# Patient Record
Sex: Female | Born: 1985 | Race: White | Hispanic: No | Marital: Married | Smoking: Never smoker
Health system: Southern US, Community
[De-identification: ages and names within clinical notes are randomized; demographics above are authoritative.]

## PROBLEM LIST (undated history)

## (undated) ENCOUNTER — Inpatient Hospital Stay (HOSPITAL_COMMUNITY): Payer: Self-pay

## (undated) DIAGNOSIS — I341 Nonrheumatic mitral (valve) prolapse: Secondary | ICD-10-CM

## (undated) DIAGNOSIS — F32A Depression, unspecified: Secondary | ICD-10-CM

## (undated) DIAGNOSIS — F419 Anxiety disorder, unspecified: Secondary | ICD-10-CM

## (undated) DIAGNOSIS — M51369 Other intervertebral disc degeneration, lumbar region without mention of lumbar back pain or lower extremity pain: Secondary | ICD-10-CM

## (undated) DIAGNOSIS — M5136 Other intervertebral disc degeneration, lumbar region: Secondary | ICD-10-CM

## (undated) DIAGNOSIS — J45909 Unspecified asthma, uncomplicated: Secondary | ICD-10-CM

## (undated) DIAGNOSIS — O139 Gestational [pregnancy-induced] hypertension without significant proteinuria, unspecified trimester: Secondary | ICD-10-CM

## (undated) DIAGNOSIS — O24419 Gestational diabetes mellitus in pregnancy, unspecified control: Secondary | ICD-10-CM

## (undated) DIAGNOSIS — K760 Fatty (change of) liver, not elsewhere classified: Secondary | ICD-10-CM

## (undated) HISTORY — PX: WISDOM TOOTH EXTRACTION: SHX21

---

## 2020-06-03 ENCOUNTER — Inpatient Hospital Stay (HOSPITAL_COMMUNITY)
Admission: AD | Admit: 2020-06-03 | Discharge: 2020-06-03 | Disposition: A | Discharge status: Home / Self Care | Payer: Managed Care, Other (non HMO) | Attending: Obstetrics and Gynecology | Admitting: Obstetrics and Gynecology

## 2020-06-03 ENCOUNTER — Other Ambulatory Visit: Payer: Self-pay

## 2020-06-03 ENCOUNTER — Encounter (HOSPITAL_COMMUNITY): Payer: Self-pay | Admitting: Obstetrics and Gynecology

## 2020-06-03 DIAGNOSIS — Z3A09 9 weeks gestation of pregnancy: Secondary | ICD-10-CM | POA: Insufficient documentation

## 2020-06-03 DIAGNOSIS — U071 COVID-19: Secondary | ICD-10-CM | POA: Diagnosis not present

## 2020-06-03 DIAGNOSIS — Z3A08 8 weeks gestation of pregnancy: Secondary | ICD-10-CM

## 2020-06-03 DIAGNOSIS — O98511 Other viral diseases complicating pregnancy, first trimester: Secondary | ICD-10-CM | POA: Diagnosis present

## 2020-06-03 MED ORDER — FLUTICASONE PROPIONATE 50 MCG/ACT NA SUSP: 1.0000 | Freq: Every day | NASAL | 2 refills | Status: DC

## 2020-06-03 NOTE — MAU Note (Addendum)
Drove home from work last night, works at [employer] well, had cold chills.  Woke up with fever.  Highest 102.5.  +home covid. test this morning. Grewal said to come in if fever got to 102.5.  Massive body aches, chills.headache.  Had 3 dizzy spells.  Confirmed IUP on [place] last Friday in office.  Was taking Tylenol q4hrs, last was 1000mg  at 1600.

## 2020-06-03 NOTE — Discharge Instructions (Signed)
Start flonase 1 spray each nare twice a day. Start Vitamin C Start Zinc. Return if increasing shortness of breath, vomiting. We will refer you to infusion center for monoclonal antibodies.

## 2020-06-03 NOTE — MAU Provider Note (Signed)
Event Date/Time   First Provider Initiated Contact with Patient 06/03/20 1719      S Ms. Danene Montijo is a 35 y.o. G1P0 patient who presents to MAU today with complaint of COVID 19 symptoms: fatigue, sore throat, fever. Symptoms started last night and had to call out of work. She took a test this morning, which was positive. Her OB doctor sent her here for evaluation. 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   O BP 126/79 (BP Location: Right Arm)   Pulse 96   Temp 99.2 F (37.3 C) (Oral)   Resp 20   SpO2 99%  Physical Exam Vitals and nursing note reviewed.  Cardiovascular:     Rate and Rhythm: Normal rate and regular rhythm.     Pulses: Normal pulses.     Heart sounds: Normal heart sounds.  Pulmonary:     Effort: Pulmonary effort is normal.     Breath sounds: Normal breath sounds.  Abdominal:     General: Abdomen is flat.     Palpations: Abdomen is soft.  Skin:    Capillary Refill: Capillary refill takes less than 2 seconds.  Neurological:     General: No focal deficit present.  Psychiatric:        Mood and Affect: Mood normal.        Behavior: Behavior normal.        Thought Content: Thought content normal.        Judgment: Judgment normal.     A Medical screening exam complete COVID   P Discharge from MAU in stable condition Start flonase to try prevent severe disease Start zinc/vitamin C.  Will get in to infusion center  Levie Heritage, DO 06/03/2020 5:29 PM

## 2020-06-04 ENCOUNTER — Telehealth: Payer: Self-pay | Admitting: Physician Assistant

## 2020-06-04 NOTE — Telephone Encounter (Signed)
Called to discuss with patient about COVID-19 symptoms and the use of one of the available treatments for those with mild to moderate Covid symptoms and at a high risk of hospitalization.  Pt appears to qualify for outpatient treatment due to co-morbid conditions and/or a member of an at-risk group in accordance with the FDA Emergency Use Authorization.    Symptom onset: 5/1 per MAU provider notes Vaccinated: no  Booster? no Immunocompromised? no Qualifiers: pregnant- 8 weeks NIH Criteria: 1  Unable to reach pt - left VM to call us back    [name]

## 2020-07-23 ENCOUNTER — Encounter: Payer: Self-pay | Admitting: Orthopaedic Surgery

## 2020-07-23 ENCOUNTER — Ambulatory Visit (INDEPENDENT_AMBULATORY_CARE_PROVIDER_SITE_OTHER): Payer: Managed Care, Other (non HMO) | Admitting: Orthopaedic Surgery

## 2020-07-23 DIAGNOSIS — M7062 Trochanteric bursitis, left hip: Secondary | ICD-10-CM | POA: Diagnosis not present

## 2020-07-23 DIAGNOSIS — M5432 Sciatica, left side: Secondary | ICD-10-CM | POA: Diagnosis not present
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
## 2020-07-24 MED ORDER — BUPIVACAINE HCL 0.25 % IJ SOLN
2.0000 mL | INTRAMUSCULAR | Status: AC | PRN
  Administered 2020-07-23: 2 mL via INTRA_ARTICULAR

## 2020-07-24 MED ORDER — LIDOCAINE HCL 1 % IJ SOLN
3.0000 mL | INTRAMUSCULAR | Status: AC | PRN
  Administered 2020-07-23: 3 mL

## 2020-07-24 MED ORDER — METHYLPREDNISOLONE ACETATE 40 MG/ML IJ SUSP
40.0000 mg | INTRAMUSCULAR | Status: AC | PRN
  Administered 2020-07-23: 40 mg via INTRA_ARTICULAR

## 2020-08-23 DIAGNOSIS — Z363 Encounter for antenatal screening for malformations: Secondary | ICD-10-CM | POA: Diagnosis not present

## 2020-08-23 DIAGNOSIS — Z3A20 20 weeks gestation of pregnancy: Secondary | ICD-10-CM | POA: Diagnosis not present

## 2020-08-23 DIAGNOSIS — Z3400 Encounter for supervision of normal first pregnancy, unspecified trimester: Secondary | ICD-10-CM | POA: Diagnosis not present

## 2020-09-17 DIAGNOSIS — Z3A23 23 weeks gestation of pregnancy: Secondary | ICD-10-CM | POA: Diagnosis not present

## 2020-09-17 DIAGNOSIS — Z362 Encounter for other antenatal screening follow-up: Secondary | ICD-10-CM | POA: Diagnosis not present

## 2020-09-17 DIAGNOSIS — Z3400 Encounter for supervision of normal first pregnancy, unspecified trimester: Secondary | ICD-10-CM | POA: Diagnosis not present

## 2020-10-09 DIAGNOSIS — Z3480 Encounter for supervision of other normal pregnancy, unspecified trimester: Secondary | ICD-10-CM | POA: Diagnosis not present

## 2020-10-09 DIAGNOSIS — Z3400 Encounter for supervision of normal first pregnancy, unspecified trimester: Secondary | ICD-10-CM | POA: Diagnosis not present

## 2020-10-09 DIAGNOSIS — Z23 Encounter for immunization: Secondary | ICD-10-CM | POA: Diagnosis not present

## 2020-10-09 DIAGNOSIS — Z3A26 26 weeks gestation of pregnancy: Secondary | ICD-10-CM | POA: Diagnosis not present

## 2020-10-21 DIAGNOSIS — O99810 Abnormal glucose complicating pregnancy: Secondary | ICD-10-CM | POA: Diagnosis not present

## 2020-11-06 ENCOUNTER — Encounter: Payer: 59 | Attending: Obstetrics and Gynecology | Admitting: Registered"

## 2020-11-06 ENCOUNTER — Other Ambulatory Visit: Payer: Self-pay

## 2020-11-06 ENCOUNTER — Encounter: Payer: Self-pay | Admitting: Registered"

## 2020-11-06 DIAGNOSIS — O24419 Gestational diabetes mellitus in pregnancy, unspecified control: Secondary | ICD-10-CM | POA: Insufficient documentation

## 2020-11-06 NOTE — Progress Notes (Signed)
Patient was seen on 11/06/20 for Gestational Diabetes self-management class at the [facility]. The following learning objectives were met by the patient during this course:  States the definition of Gestational Diabetes States why dietary management is important in controlling blood glucose Describes the effects each nutrient has on blood glucose levels Demonstrates ability to create a balanced meal plan Demonstrates carbohydrate counting  States when to check blood glucose levels Demonstrates proper blood glucose monitoring techniques States the effect of stress and exercise on blood glucose levels States the importance of limiting caffeine and abstaining from alcohol and smoking  Blood glucose monitor given: none - Patient has [employer] and needs [employer]. [facility] does not have samples of this brand of meter.  Patient instructed to monitor glucose levels: FBS: 60 - <95; 1 hour: <140; 2 hour: <120  Patient received handouts: Nutrition Diabetes and Pregnancy, including carb counting list  Patient will be seen for follow-up as needed.

## 2020-11-11 ENCOUNTER — Other Ambulatory Visit (HOSPITAL_COMMUNITY): Payer: Self-pay

## 2020-11-11 MED ORDER — FREESTYLE LANCETS MISC
0 refills | Status: DC
  Filled 2020-11-11: qty 100, 25d supply, fill #0

## 2020-11-11 MED ORDER — FREESTYLE LITE W/DEVICE KIT
PACK | Freq: Four times a day (QID) | 0 refills | Status: DC
  Filled 2020-11-11: qty 1, 30d supply, fill #0

## 2020-11-11 MED ORDER — FREESTYLE LITE TEST VI STRP
ORAL_STRIP | Freq: Four times a day (QID) | 0 refills | Status: DC
  Filled 2020-11-11: qty 100, 25d supply, fill #0

## 2020-11-12 ENCOUNTER — Other Ambulatory Visit (HOSPITAL_COMMUNITY): Payer: Self-pay

## 2020-11-15 ENCOUNTER — Other Ambulatory Visit (HOSPITAL_COMMUNITY): Payer: Self-pay

## 2020-11-20 DIAGNOSIS — Z3A32 32 weeks gestation of pregnancy: Secondary | ICD-10-CM | POA: Diagnosis not present

## 2020-11-20 DIAGNOSIS — Z3400 Encounter for supervision of normal first pregnancy, unspecified trimester: Secondary | ICD-10-CM | POA: Diagnosis not present

## 2020-11-20 DIAGNOSIS — O2693 Pregnancy related conditions, unspecified, third trimester: Secondary | ICD-10-CM | POA: Diagnosis not present

## 2020-11-28 ENCOUNTER — Inpatient Hospital Stay (HOSPITAL_BASED_OUTPATIENT_CLINIC_OR_DEPARTMENT_OTHER): Payer: 59

## 2020-11-28 ENCOUNTER — Other Ambulatory Visit: Payer: Self-pay

## 2020-11-28 ENCOUNTER — Inpatient Hospital Stay (HOSPITAL_COMMUNITY)
Admission: AD | Admit: 2020-11-28 | Discharge: 2020-12-06 | DRG: 786 | Disposition: A | Discharge status: Home / Self Care | Payer: 59 | Attending: Obstetrics and Gynecology | Admitting: Obstetrics and Gynecology

## 2020-11-28 ENCOUNTER — Encounter (HOSPITAL_COMMUNITY): Payer: Self-pay | Admitting: Obstetrics and Gynecology

## 2020-11-28 DIAGNOSIS — O1494 Unspecified pre-eclampsia, complicating childbirth: Secondary | ICD-10-CM | POA: Diagnosis not present

## 2020-11-28 DIAGNOSIS — Z20822 Contact with and (suspected) exposure to covid-19: Secondary | ICD-10-CM | POA: Diagnosis present

## 2020-11-28 DIAGNOSIS — O99213 Obesity complicating pregnancy, third trimester: Secondary | ICD-10-CM

## 2020-11-28 DIAGNOSIS — O99824 Streptococcus B carrier state complicating childbirth: Secondary | ICD-10-CM | POA: Diagnosis present

## 2020-11-28 DIAGNOSIS — O99892 Other specified diseases and conditions complicating childbirth: Secondary | ICD-10-CM | POA: Diagnosis not present

## 2020-11-28 DIAGNOSIS — O26893 Other specified pregnancy related conditions, third trimester: Secondary | ICD-10-CM | POA: Diagnosis present

## 2020-11-28 DIAGNOSIS — Z3A33 33 weeks gestation of pregnancy: Secondary | ICD-10-CM | POA: Diagnosis not present

## 2020-11-28 DIAGNOSIS — O24420 Gestational diabetes mellitus in childbirth, diet controlled: Secondary | ICD-10-CM | POA: Diagnosis present

## 2020-11-28 DIAGNOSIS — O09523 Supervision of elderly multigravida, third trimester: Secondary | ICD-10-CM | POA: Diagnosis not present

## 2020-11-28 DIAGNOSIS — O24419 Gestational diabetes mellitus in pregnancy, unspecified control: Secondary | ICD-10-CM | POA: Diagnosis not present

## 2020-11-28 DIAGNOSIS — O42113 Preterm premature rupture of membranes, onset of labor more than 24 hours following rupture, third trimester: Secondary | ICD-10-CM

## 2020-11-28 DIAGNOSIS — O24410 Gestational diabetes mellitus in pregnancy, diet controlled: Secondary | ICD-10-CM | POA: Diagnosis not present

## 2020-11-28 DIAGNOSIS — L299 Pruritus, unspecified: Secondary | ICD-10-CM | POA: Diagnosis present

## 2020-11-28 DIAGNOSIS — Z8759 Personal history of other complications of pregnancy, childbirth and the puerperium: Secondary | ICD-10-CM

## 2020-11-28 DIAGNOSIS — Z3A34 34 weeks gestation of pregnancy: Secondary | ICD-10-CM | POA: Diagnosis not present

## 2020-11-28 DIAGNOSIS — O43893 Other placental disorders, third trimester: Secondary | ICD-10-CM | POA: Diagnosis not present

## 2020-11-28 DIAGNOSIS — M545 Low back pain, unspecified: Secondary | ICD-10-CM | POA: Diagnosis present

## 2020-11-28 DIAGNOSIS — Z3689 Encounter for other specified antenatal screening: Secondary | ICD-10-CM

## 2020-11-28 DIAGNOSIS — O42913 Preterm premature rupture of membranes, unspecified as to length of time between rupture and onset of labor, third trimester: Principal | ICD-10-CM | POA: Diagnosis present

## 2020-11-28 DIAGNOSIS — E669 Obesity, unspecified: Secondary | ICD-10-CM | POA: Diagnosis not present

## 2020-11-28 DIAGNOSIS — G8929 Other chronic pain: Secondary | ICD-10-CM | POA: Diagnosis not present

## 2020-11-28 DIAGNOSIS — O99214 Obesity complicating childbirth: Secondary | ICD-10-CM | POA: Diagnosis not present

## 2020-11-28 DIAGNOSIS — O411230 Chorioamnionitis, third trimester, not applicable or unspecified: Secondary | ICD-10-CM | POA: Diagnosis present

## 2020-11-28 DIAGNOSIS — O24429 Gestational diabetes mellitus in childbirth, unspecified control: Secondary | ICD-10-CM | POA: Diagnosis not present

## 2020-11-28 DIAGNOSIS — O1404 Mild to moderate pre-eclampsia, complicating childbirth: Secondary | ICD-10-CM | POA: Diagnosis not present

## 2020-11-28 HISTORY — DX: Nonrheumatic mitral (valve) prolapse: I34.1

## 2020-11-28 LAB — URINALYSIS, ROUTINE W REFLEX MICROSCOPIC
Bilirubin Urine: NEGATIVE
Glucose, UA: NEGATIVE mg/dL
Hgb urine dipstick: NEGATIVE
Ketones, ur: NEGATIVE mg/dL
Nitrite: NEGATIVE
Protein, ur: NEGATIVE mg/dL
Specific Gravity, Urine: 1.013 (ref 1.005–1.030)
pH: 6 (ref 5.0–8.0)

## 2020-11-28 LAB — RPR: RPR Ser Ql: NONREACTIVE

## 2020-11-28 LAB — CBC
HCT: 32.9 % — ABNORMAL LOW (ref 36.0–46.0)
Hemoglobin: 10.7 g/dL — ABNORMAL LOW (ref 12.0–15.0)
MCH: 29.5 pg (ref 26.0–34.0)
MCHC: 32.5 g/dL (ref 30.0–36.0)
MCV: 90.6 fL (ref 80.0–100.0)
Platelets: 420 10*3/uL — ABNORMAL HIGH (ref 150–400)
RBC: 3.63 MIL/uL — ABNORMAL LOW (ref 3.87–5.11)
RDW: 13 % (ref 11.5–15.5)
WBC: 12.1 10*3/uL — ABNORMAL HIGH (ref 4.0–10.5)
nRBC: 0 % (ref 0.0–0.2)

## 2020-11-28 LAB — RESP PANEL BY RT-PCR (FLU A&B, COVID) ARPGX2
Influenza A by PCR: NEGATIVE
Influenza B by PCR: NEGATIVE
SARS Coronavirus 2 by RT PCR: NEGATIVE

## 2020-11-28 LAB — TYPE AND SCREEN
ABO/RH(D): A POS
Antibody Screen: NEGATIVE

## 2020-11-28 LAB — GLUCOSE, CAPILLARY
Glucose-Capillary: 157 mg/dL — ABNORMAL HIGH (ref 70–99)
Glucose-Capillary: 197 mg/dL — ABNORMAL HIGH (ref 70–99)

## 2020-11-28 LAB — GROUP B STREP BY PCR: Group B strep by PCR: POSITIVE — AB

## 2020-11-28 MED ORDER — LACTATED RINGERS IV SOLN: INTRAVENOUS | Status: DC

## 2020-11-28 MED ORDER — AZITHROMYCIN 250 MG PO TABS
1000.0000 mg | ORAL_TABLET | Freq: Once | ORAL | Status: AC
  Administered 2020-11-28: 1000 mg via ORAL
  Filled 2020-11-28: qty 4

## 2020-11-28 MED ORDER — SODIUM CHLORIDE 0.9 % IV SOLN
2.0000 g | Freq: Four times a day (QID) | INTRAVENOUS | Status: AC
  Administered 2020-11-28 – 2020-11-30 (×8): 2 g via INTRAVENOUS
  Filled 2020-11-28 (×8): qty 2000

## 2020-11-28 MED ORDER — LACTATED RINGERS IV BOLUS: 1000.0000 mL | Freq: Once | INTRAVENOUS | Status: DC

## 2020-11-28 MED ORDER — AMOXICILLIN 500 MG PO CAPS
500.0000 mg | ORAL_CAPSULE | Freq: Three times a day (TID) | ORAL | Status: DC
  Administered 2020-11-30 – 2020-12-02 (×8): 500 mg via ORAL
  Filled 2020-11-28 (×8): qty 1

## 2020-11-28 MED ORDER — SOD CITRATE-CITRIC ACID 500-334 MG/5ML PO SOLN
30.0000 mL | Freq: Once | ORAL | Status: AC
  Administered 2020-12-02: 30 mL via ORAL
  Filled 2020-11-28 (×2): qty 30

## 2020-11-28 MED ORDER — ZOLPIDEM TARTRATE 5 MG PO TABS: 5.0000 mg | ORAL_TABLET | Freq: Every evening | ORAL | Status: DC | PRN

## 2020-11-28 MED ORDER — BETAMETHASONE SOD PHOS & ACET 6 (3-3) MG/ML IJ SUSP
12.0000 mg | Freq: Once | INTRAMUSCULAR | Status: AC
  Administered 2020-11-28: 12 mg via INTRAMUSCULAR
  Filled 2020-11-28: qty 5

## 2020-11-28 MED ORDER — LORATADINE 10 MG PO TABS
10.0000 mg | ORAL_TABLET | Freq: Every day | ORAL | Status: DC
  Administered 2020-11-28 – 2020-12-01 (×4): 10 mg via ORAL
  Filled 2020-11-28 (×4): qty 1

## 2020-11-28 MED ORDER — FAMOTIDINE IN NACL 20-0.9 MG/50ML-% IV SOLN
20.0000 mg | Freq: Once | INTRAVENOUS | Status: AC
  Administered 2020-12-02: 20 mg via INTRAVENOUS
  Filled 2020-11-28: qty 50

## 2020-11-28 MED ORDER — ACETAMINOPHEN 500 MG PO TABS
1000.0000 mg | ORAL_TABLET | Freq: Four times a day (QID) | ORAL | Status: DC | PRN
  Administered 2020-11-28 – 2020-12-02 (×10): 1000 mg via ORAL
  Filled 2020-11-28 (×10): qty 2

## 2020-11-28 MED ORDER — CYCLOBENZAPRINE HCL 10 MG PO TABS
10.0000 mg | ORAL_TABLET | Freq: Every day | ORAL | Status: DC
  Administered 2020-11-28 – 2020-11-29 (×2): 10 mg via ORAL
  Filled 2020-11-28 (×2): qty 1

## 2020-11-28 MED ORDER — BETAMETHASONE SOD PHOS & ACET 6 (3-3) MG/ML IJ SUSP
12.0000 mg | Freq: Once | INTRAMUSCULAR | Status: AC
  Administered 2020-11-29: 12 mg via INTRAMUSCULAR

## 2020-11-28 MED ORDER — MAGNESIUM OXIDE -MG SUPPLEMENT 400 (240 MG) MG PO TABS
400.0000 mg | ORAL_TABLET | Freq: Every day | ORAL | Status: DC
  Administered 2020-11-28 – 2020-12-01 (×4): 400 mg via ORAL
  Filled 2020-11-28 (×4): qty 1

## 2020-11-28 NOTE — MAU Note (Signed)
NICU notified of pt's admission and status.  
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
## 2020-11-28 NOTE — MAU Note (Signed)
Vertex presentation verified via bedside ultrasound by Wynelle Bourgeois, CNM

## 2020-11-28 NOTE — H&P (Addendum)
[name] is a 35 y.o. female presenting for leaking of fluid at 1 am and again 2 am. No fever, no bleeding, no pain, few mild UCs here in MAU. Pregnancy complicated by AMA>normal Panorama, lumbar back pain and plans for a primary cesarean section, A1GDM with FBS <100. OB History     Gravida  1   Para      Term      Preterm      AB      Living         SAB      IAB      Ectopic      Multiple      Live Births             Past Medical History:  Diagnosis Date   Mitral valve prolapse    Past Surgical History:  Procedure Laterality Date   WISDOM TOOTH EXTRACTION     Family History: family history includes Cancer in her father; Hypertension in her mother. Social History:  reports that she has never smoked. She has never used smokeless tobacco. She reports that she does not drink alcohol and does not use drugs.     Maternal Diabetes: Yes:  Diabetes Type:  Diet controlled Genetic Screening: Normal Maternal Ultrasounds/Referrals: Normal Fetal Ultrasounds or other Referrals:  None Maternal Substance Abuse:  No Significant Maternal Medications:  None Significant Maternal Lab Results:  None Other Comments:  None  Review of Systems  Constitutional:  Negative for fever.  Eyes:  Negative for visual disturbance.  Gastrointestinal:  Negative for abdominal pain.  Neurological:  Negative for headaches.  Maternal Medical History:  Reason for admission: Rupture of membranes.   Fetal activity: Perceived fetal activity is normal.      Blood pressure 118/79, pulse (!) 103, temperature 98.2 F (36.8 C), temperature source Oral, resp. rate 18, height 5\' 3"  (1.6 m), weight 123.8 kg. Maternal Exam:  Abdomen: Fetal presentation: vertex   Fetal Exam Fetal State Assessment: Category I - tracings are normal.  Physical Exam Cardiovascular:     Rate and Rhythm: Normal rate.  Pulmonary:     Effort: Pulmonary effort is normal.    Uterus soft, NT Grossly ruptured,  fern positive per RN exam BSUS>vtx  Prenatal labs: ABO, Rh:   Antibody:   Rubella:   RPR:    HBsAg:    HIV:    GBS:     Assessment/Plan: 35 yo G1P0  PPROM at this time no evidence of infection, reassuring fetal status and not in labor  Latency ATB started  BMZ #1 done  IV fluids  U/S, MFM consult  D/W Neonatology>will consult D/W possible C/S for delivery because of back pain.  30 II 11/28/2020, 7:53 AM

## 2020-11-28 NOTE — Progress Notes (Signed)
S:Doing well since being admitted an hour or two ago.  Reports no contractions or bleeding.  +FM.   O:  Today's Vitals   11/28/20 0605 11/28/20 0606 11/28/20 0617 11/28/20 0828  BP: (!) 135/97  118/79 118/79  Pulse: (!) 113  (!) 103 (!) 103  Resp: 18   18  Temp: 98.2 F (36.8 C)     TempSrc: Oral     SpO2:    100%  Weight: 123.8 kg     Height: 5\' 3"  (1.6 m)     PainSc:  3      Body mass index is 48.36 kg/m.  NAD, A&O NWOB Abd soft, nondistended, gravid   A/P: 35yo G1P0 @ 33.6 wga with PPROM.   # PPROM - counseled regarding risks of expectant management including abruption, infection, and IUFD. Discussed indications for delivery including infection, labor, abruption or non-reassuring fetal status.  - Immediate delivery after BMZ vs expectant management was d/w patient in detail. I reviewed that historically, we delivered patients at 71 wga for PPROM. However, recent studies demonstrated that they are potential benefits to expectant management until 37.0 wga. One large study showed no significant difference in neonatal sepsis or morbidity but an increased risk of respiratory distress, NICU length of stay, and mechanical ventilation in the immediate delivery group. However, the maternal outcomes were 2 times higher for hemorrhage and infection in the expectant management to 37 wga group. Another study showed results and in addition showed higher risk of cesarean with immediate delivery.  We reviewed that latency antibiotics are not indicated if she desires expectant management. She is GBS positive and needs PCN in labor. Latency antibiotics will cover it for now.  I reiterated that expectant management would include VERY careful monitoring of s/s of chorio, maternal infection, and antepartum hemorrhage.  - We discussed tocolysis is not typically indicated as it can be associated with a higher risk of chorioamnionitis. In active labor, it is never indicated. It can be considered to  achieve BMZ administration at < 34.0 wga but used cautiously and never in the setting of infection or abruption.  - BMZ for FLM - Latency abx - No s/s of abruption, infection, or labor. Cervix closed visually on exam in MAU   # MWB - Liquid diet for now with IVF - A1GDM - great control. Continue FBS and 2 hr pp - Chronic back pain - Tylenol prn and Flexeril QHS  # FWB - CEFM - Growth 20 with MFM requested today - BMZ #1 10/27, #2 DUE 10/28  # ROD  - uncdecided. Previously had desired primary elective CS given hx chronic back pain. However, given PPROM and early gestation, she is considering vaginal delivery as long as IOL wouldn't be expected to be lengthy. Will continue to address. Risks of both reviewed.  - At this time, patient and husband will not likely delivery sooner than BMZ complete if possible. Will then readdress delivery plan of 37 wga or sooner.    # GBS: POSITIVE  # Dispo  11/28 MD

## 2020-11-28 NOTE — MAU Provider Note (Addendum)
Chief Complaint:  Rupture of Membranes   Event Date/Time   First Provider Initiated Contact with Patient 11/28/20 0612     HPI: [name] is a 35 y.o. G1P0 at 43w6dho presents to maternity admissions reporting leaking of fluid since 1am.  Has some pressure but no painful contractions. . She reports good fetal movement, denies vaginal bleeding, urinary symptoms, h/a, dizziness, n/v, diarrhea, constipation or fever/chills.  .  Vaginal Discharge The patient's primary symptoms include vaginal discharge. The patient's pertinent negatives include no genital itching, genital lesions, genital odor, pelvic pain or vaginal bleeding. This is a new problem. The current episode started today. The patient is experiencing no pain. She is pregnant. Pertinent negatives include no chills, dysuria, fever, nausea or vomiting. The vaginal discharge was clear and watery. There has been no bleeding. She has not been passing clots. She has not been passing tissue. Nothing aggravates the symptoms. She has tried nothing for the symptoms.     Past Medical History: History reviewed. No pertinent past medical history.  Past obstetric history: OB History  Gravida Para Term Preterm AB Living  1            SAB IAB Ectopic Multiple Live Births               # Outcome Date GA Lbr Len/2nd Weight Sex Delivery Anes PTL Lv  1 Current             Past Surgical History: No past surgical history on file.  Family History: No family history on file.  Social History:    Allergies:  Allergies  Allergen Reactions   Shellfish-Derived Products Anaphylaxis   Latex Rash    Meds:  Medications Prior to Admission  Medication Sig Dispense Refill Last Dose   b complex vitamins capsule Take 1 capsule by mouth daily.   11/27/2020   cyclobenzaprine (FLEXERIL) 10 MG tablet Take 10 mg by mouth 3 (three) times daily as needed for muscle spasms.   11/27/2020   levocetirizine (XYZAL) 5 MG tablet Take 5 mg by mouth every  evening.   11/27/2020   magnesium oxide (MAG-OX) 400 MG tablet Take 400 mg by mouth daily.   11/27/2020   Prenatal Vit-Fe Fumarate-FA (MULTIVITAMIN-PRENATAL) 27-0.8 MG TABS tablet Take 1 tablet by mouth daily at 12 noon.   11/27/2020   Blood Glucose Monitoring Suppl (FREESTYLE LITE) w/Device KIT Use as directed to test 4 times a day 1 kit 0    fluticasone (FLONASE) 50 MCG/ACT nasal spray Place 1 spray into both nostrils daily. 11.1 mL 2    glucose blood (FREESTYLE LITE) test strip Use as directed to TEST 4 TIMES DAILY 100 strip 0    Lancets (FREESTYLE) lancets Use as directed to test 4 times a day 100 each 0     I have reviewed patient's Past Medical Hx, Surgical Hx, Family Hx, Social Hx, medications and allergies.   ROS:  Review of Systems  Constitutional:  Negative for chills and fever.  Gastrointestinal:  Negative for nausea and vomiting.  Genitourinary:  Positive for vaginal discharge. Negative for dysuria and pelvic pain.  Other systems negative  Physical Exam   Vitals:   11/28/20 0605 11/28/20 0617  BP: (!) 135/97 118/79  Pulse: (!) 113 (!) 103  Resp: 18   Temp: 98.2 F (36.8 C)   TempSrc: Oral   Weight: 123.8 kg   Height: '5\' 3"'  (1.6 m)     Constitutional: Well-developed, well-nourished female in no acute distress.  Cardiovascular: normal rate and rhythm Respiratory: normal effort, clear to auscultation bilaterally GI: Abd soft, non-tender, gravid appropriate for gestational age.   No rebound or guarding. MS: Extremities nontender, no edema, normal ROM Neurologic: Alert and oriented x 4.  GU: Neg CVAT.  PELVIC EXAM:   Grossly ruptured per RN with + ferning  FHT:  Baseline 130 , moderate variability, accelerations present, no decelerations Contractions: Uterine irritability   Labs: Labs ordered    Imaging:  Pt informed that the ultrasound is considered a limited OB ultrasound and is not intended to be a complete ultrasound exam.  Patient also informed that the  ultrasound is not being completed with the intent of assessing for fetal or placental anomalies or any pelvic abnormalities.  Explained that the purpose of today's ultrasound is to assess for presentation.   Patient acknowledges the purpose of the exam and the limitations of the study.  Fetus is in the vertex presentation  MAU Course/MDM: I have ordered labs and IV, Betamethasone NST reviewed, reassuring Consult Dr Gaetano Net  with presentation, exam findings and test results.  Treatments in MAU included EFM.    Assessment: Single IUP at 20w6dPPROM Uterine irritability  Plan: Admit per Dr TGaetano NetPer records, planning elective Cesarean due to lumbar issues Betamethasone Latency Antibiotics MD to follow  MHansel FeinsteinCNM, MSN Certified Nurse-Midwife 11/28/2020 6:12 AM

## 2020-11-28 NOTE — Consult Note (Signed)
Asked by Dr.Tomblin to provide prenatal consultation for 35 y.o. G1 P0 who is now 33.[redacted] weeks EGA, with pregnancy complicated by PROM.  Presented this morning with leaking fluid, no signs of infection or preterm labor. She is being treated with betamethasone and antibiotics. Plan to defer delivery pending BMZ maturity unless there are signs of infection. Patient also has diet-controlled gestational DM.  Discussed with patient and FOB usual expectations for preterm infant at 4 - [redacted] weeks gestation, including possible needs for respiratory support, temp support, NG feedings, and IV access. Explained family-centered care approach including parent participation in rounds, decision-making, and also presented usual criteria for discharge. Projected possible length of stay in NICU until 37 - [redacted] wks EGA.  Discussed advantages of feeding with mother's milk and possible use of donor milk as "bridge" if needed until her supply is sufficient. Patient's mother is an LC with [employer].   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 They were attentive, had appropriate questions, and expressed appreciation for my input.  Thank you for consulting Neonatology.  Total time 25, face-to-face time 20 minutes  JWimmer, MD

## 2020-11-29 ENCOUNTER — Ambulatory Visit: Payer: Managed Care, Other (non HMO) | Admitting: Cardiology

## 2020-11-29 DIAGNOSIS — Z8759 Personal history of other complications of pregnancy, childbirth and the puerperium: Secondary | ICD-10-CM

## 2020-11-29 LAB — GLUCOSE, CAPILLARY
Glucose-Capillary: 115 mg/dL — ABNORMAL HIGH (ref 70–99)
Glucose-Capillary: 125 mg/dL — ABNORMAL HIGH (ref 70–99)
Glucose-Capillary: 129 mg/dL — ABNORMAL HIGH (ref 70–99)
Glucose-Capillary: 157 mg/dL — ABNORMAL HIGH (ref 70–99)

## 2020-11-29 MED ORDER — PRENATAL MULTIVITAMIN CH
1.0000 | ORAL_TABLET | Freq: Every day | ORAL | Status: DC
  Administered 2020-11-29 – 2020-12-02 (×4): 1 via ORAL
  Filled 2020-11-29 (×3): qty 1

## 2020-11-29 MED ORDER — B COMPLEX-C PO TABS
1.0000 | ORAL_TABLET | Freq: Every day | ORAL | Status: DC
  Administered 2020-11-30 – 2020-12-02 (×3): 1 via ORAL
  Filled 2020-11-29 (×4): qty 1

## 2020-11-29 NOTE — Progress Notes (Signed)
Patient ID: [name], female   DOB: 11-15-1985, 35 y.o.   MRN: [id number]   [name] is without complaints GFM  Occas leaking of clear fluid No ctxs or pain Was not on her best diet yesterday   VSSAF      98.7 F (37.1 C) 102 Abnormal  -- 19 133/74 Sitting 100 % Room Air -- -- --  FHR 140s with accels Ctxs none  Abd:  gravid nt Neg homans bilaterally  IUP at 34 0/[redacted] weeks EGA PPROM Now on latency abx S/P BMZ series GBS positive NICU consult completed A1DM previously in good control - re institute diabetic diet and some elevation expected from BMZ.  Continue to monitor Discussed timing of delivery.  Current no evidence of chorio and great fetal well being She desires primary c/s due to lower back problems.  Wants to consider 35 week range unless status changes

## 2020-11-30 LAB — GLUCOSE, CAPILLARY
Glucose-Capillary: 121 mg/dL — ABNORMAL HIGH (ref 70–99)
Glucose-Capillary: 100 mg/dL — ABNORMAL HIGH (ref 70–99)
Glucose-Capillary: 90 mg/dL (ref 70–99)
Glucose-Capillary: 94 mg/dL (ref 70–99)

## 2020-11-30 MED ORDER — CYCLOBENZAPRINE HCL 10 MG PO TABS
10.0000 mg | ORAL_TABLET | Freq: Three times a day (TID) | ORAL | Status: DC | PRN
  Administered 2020-11-30 – 2020-12-02 (×6): 10 mg via ORAL
  Filled 2020-11-30 (×6): qty 1

## 2020-11-30 NOTE — Progress Notes (Signed)
Patient ID: [name], female   DOB: November 17, 1985, 35 y.o.   MRN: [id number] No complaints today GFM  No ctxs Leaking clear fluid Compliant with diabetic diet  VSSAF  Glc levels 120-150s FHR 140s with accels No ctxs  Abd Gravid, nt Neg homan's bilaterally  FHR 140s with accels Ctxs none   Abd:  gravid nt Neg homans bilaterally   IUP at 34 1/[redacted] weeks EGA PPROM Now on latency abx S/P BMZ series GBS positive NICU consult completed A1DM previously in good control -diabetic diet and  with some elevation expected from BMZ. Will ask for diabetic coordinator to evaluate and office suggestions. Discussed timing of delivery.  Current no evidence of chorio and great fetal well being She desires primary c/s due to lower back problems.  Wants to consider 35 week range unless status changes

## 2020-11-30 NOTE — Progress Notes (Signed)
Received call back from Diabetes Coordinator.  Will look over chart and send recommendation.

## 2020-11-30 NOTE — Progress Notes (Signed)
Tray taken up

## 2020-11-30 NOTE — Progress Notes (Signed)
Inpatient Diabetes Program Recommendations  Diabetes Treatment Program Recommendations  ADA Standards of Care Diabetes in Pregnancy Target Glucose Ranges:  Fasting: 60 - 90 mg/dL Preprandial: 60 - 578 mg/dL 1 hr postprandial: Less than 140mg /dL (from first bite of meal) 2 hr postprandial: Less than 120 mg/dL (from first bite of meal)    Results for ALIE, MOUDY (MRN [name]) as of 11/30/2020 16:54  Ref. Range 11/30/2020 07:47 11/30/2020 12:24 11/30/2020 15:29  Glucose-Capillary Latest Ref Range: 70 - 99 mg/dL 12/02/2020 (H) 413 (H) 90  Results for PRABHNOOR, ELLENBERGER (MRN [name]) as of 11/30/2020 16:54  Ref. Range 11/29/2020 06:19 11/29/2020 10:07 11/29/2020 15:39 11/29/2020 21:05  Glucose-Capillary Latest Ref Range: 70 - 99 mg/dL 12/01/2020 (H)   Betamethasone 12 mg @6 :18 125 (H) 129 (H) 157 (H)  Results for JINNIFER, MONTEJANO (MRN ) as of 11/30/2020 16:54  Ref. Range 11/28/2020 7:14 11/28/2020 14:58 11/28/2020 20:22  Glucose-Capillary Latest Ref Range: 70 - 99 mg/dL     Betamethasone 12 mg 157 (H) 197 (H)    Review of Glycemic Control  Diabetes history: GDM; [redacted]W[redacted]D Outpatient Diabetes medications: None; diet controlled Current orders for Inpatient glycemic control: CBG monitoring  Inpatient Diabetes Program Recommendations:    Insulin: May want to consider ordering Novolog 0-14 units QID (fasting and 2 hour post prandial) for correction if needed.  NOTE: Received page from RN regarding consult for recommendations.  Diabetes Coordinator is not on campus over the weekend but available by pager from 8am to 5pm for questions or concerns. Per chart, patent has GDM which was diet controlled outpatient. Patient received Betamethasone 12 mg on 11/28/20 and second dose of Betamethasone 12 mg on 11/29/20. CBGs have ranged from 90-157 mg/dl over the past 24 hours. Would recommend ordering Novolog 0-14 units QID (fasting and 2 hour post prandial) for glucose correction if  needed. Will follow along while inpatient.  Thanks, 11/30/20, RN, MSN, CDE Diabetes Coordinator Inpatient Diabetes Program [phone] (Team Pager from 8am to 5pm)

## 2020-12-01 LAB — GLUCOSE, CAPILLARY
Glucose-Capillary: 89 mg/dL (ref 70–99)
Glucose-Capillary: 99 mg/dL (ref 70–99)
Glucose-Capillary: 95 mg/dL (ref 70–99)
Glucose-Capillary: 94 mg/dL (ref 70–99)

## 2020-12-01 LAB — CBC WITH DIFFERENTIAL/PLATELET
Abs Immature Granulocytes: 0.14 10*3/uL — ABNORMAL HIGH (ref 0.00–0.07)
Basophils Absolute: 0 10*3/uL (ref 0.0–0.1)
Basophils Relative: 0 %
Eosinophils Absolute: 0 10*3/uL (ref 0.0–0.5)
Eosinophils Relative: 0 %
HCT: 30.9 % — ABNORMAL LOW (ref 36.0–46.0)
Hemoglobin: 10.3 g/dL — ABNORMAL LOW (ref 12.0–15.0)
Immature Granulocytes: 1 %
Lymphocytes Relative: 15 %
Lymphs Abs: 1.8 10*3/uL (ref 0.7–4.0)
MCH: 30.6 pg (ref 26.0–34.0)
MCHC: 33.3 g/dL (ref 30.0–36.0)
MCV: 91.7 fL (ref 80.0–100.0)
Monocytes Absolute: 0.8 10*3/uL (ref 0.1–1.0)
Monocytes Relative: 7 %
Neutro Abs: 9.2 10*3/uL — ABNORMAL HIGH (ref 1.7–7.7)
Neutrophils Relative %: 77 %
Platelets: 382 10*3/uL (ref 150–400)
RBC: 3.37 MIL/uL — ABNORMAL LOW (ref 3.87–5.11)
RDW: 13.2 % (ref 11.5–15.5)
WBC: 11.9 10*3/uL — ABNORMAL HIGH (ref 4.0–10.5)
nRBC: 0 % (ref 0.0–0.2)

## 2020-12-01 NOTE — Progress Notes (Signed)
Patient ID: [name], female   DOB: [birth year], 35 y.o.   MRN: [id number] No complaints today GFM  No ctxs Leaking clear fluid Compliant with diabetic diet   VSSAF  Glc levels improved with 80-120s FHR 140s with accels No ctxs   Abd Gravid, nt Neg homan's bilaterally   Abd:  gravid nt Neg homans bilaterally   IUP at 34 2/[redacted] weeks EGA PPROM Now on latency abx S/P BMZ series GBS positive NICU consult completed A1DM improved yesterday with diabetic diet. Most likely some elevation from BMZ. Diabetic coordinator evaluated and recommend SSI prn, but glc levels excellent since.  Appreciate the consult. Discussed timing of delivery.  Current no evidence of chorio and great fetal well being.  She desires CBC with diff today She desires primary c/s due to lower back problems.  Wants to consider 35 week range unless status changes

## 2020-12-02 ENCOUNTER — Inpatient Hospital Stay (HOSPITAL_COMMUNITY): Payer: 59 | Admitting: Anesthesiology

## 2020-12-02 ENCOUNTER — Encounter (HOSPITAL_COMMUNITY): Payer: Self-pay | Admitting: Obstetrics and Gynecology

## 2020-12-02 ENCOUNTER — Encounter (HOSPITAL_COMMUNITY)
Admission: AD | Disposition: A | Discharge status: Home / Self Care | Payer: Self-pay | Source: Home / Self Care | Attending: Obstetrics and Gynecology

## 2020-12-02 LAB — COMPREHENSIVE METABOLIC PANEL
ALT: 92 U/L — ABNORMAL HIGH (ref 0–44)
AST: 40 U/L (ref 15–41)
Albumin: 2.6 g/dL — ABNORMAL LOW (ref 3.5–5.0)
Alkaline Phosphatase: 98 U/L (ref 38–126)
Anion gap: 10 (ref 5–15)
BUN: 13 mg/dL (ref 6–20)
CO2: 22 mmol/L (ref 22–32)
Calcium: 9.5 mg/dL (ref 8.9–10.3)
Chloride: 106 mmol/L (ref 98–111)
Creatinine, Ser: 0.68 mg/dL (ref 0.44–1.00)
GFR, Estimated: >60 mL/min (ref >60)
Glucose, Bld: 95 mg/dL (ref 70–99)
Potassium: 3.8 mmol/L (ref 3.5–5.1)
Sodium: 138 mmol/L (ref 135–145)
Total Bilirubin: 0.6 mg/dL (ref 0.3–1.2)
Total Protein: 5.9 g/dL — ABNORMAL LOW (ref 6.5–8.1)

## 2020-12-02 LAB — CBC
HCT: 31.6 % — ABNORMAL LOW (ref 36.0–46.0)
Hemoglobin: 10.5 g/dL — ABNORMAL LOW (ref 12.0–15.0)
MCH: 30.3 pg (ref 26.0–34.0)
MCHC: 33.2 g/dL (ref 30.0–36.0)
MCV: 91.1 fL (ref 80.0–100.0)
Platelets: 399 10*3/uL (ref 150–400)
RBC: 3.47 MIL/uL — ABNORMAL LOW (ref 3.87–5.11)
RDW: 13.2 % (ref 11.5–15.5)
WBC: 12.1 10*3/uL — ABNORMAL HIGH (ref 4.0–10.5)
nRBC: 0 % (ref 0.0–0.2)

## 2020-12-02 LAB — GLUCOSE, CAPILLARY
Glucose-Capillary: 89 mg/dL (ref 70–99)
Glucose-Capillary: 90 mg/dL (ref 70–99)
Glucose-Capillary: 88 mg/dL (ref 70–99)
Glucose-Capillary: 81 mg/dL (ref 70–99)
Glucose-Capillary: 95 mg/dL (ref 70–99)

## 2020-12-02 LAB — TYPE AND SCREEN
ABO/RH(D): A POS
Antibody Screen: NEGATIVE

## 2020-12-02 SURGERY — Surgical Case
Anesthesia: Spinal | Wound class: Clean Contaminated

## 2020-12-02 MED ORDER — PRENATAL MULTIVITAMIN CH
1.0000 | ORAL_TABLET | Freq: Every day | ORAL | Status: DC
  Administered 2020-12-04 – 2020-12-05 (×2): 1 via ORAL
  Filled 2020-12-02 (×2): qty 1

## 2020-12-02 MED ORDER — LACTATED RINGERS IV SOLN: INTRAVENOUS | Status: DC

## 2020-12-02 MED ORDER — DEXAMETHASONE SODIUM PHOSPHATE 4 MG/ML IJ SOLN
INTRAMUSCULAR | Status: DC | PRN
  Administered 2020-12-02: 4 mg via INTRAVENOUS

## 2020-12-02 MED ORDER — MAGNESIUM SULFATE 40 GM/1000ML IV SOLN
INTRAVENOUS | Status: AC
  Filled 2020-12-02: qty 1000

## 2020-12-02 MED ORDER — OXYTOCIN-SODIUM CHLORIDE 30-0.9 UT/500ML-% IV SOLN: 2.5000 [IU]/h | INTRAVENOUS | Status: AC

## 2020-12-02 MED ORDER — DIPHENHYDRAMINE HCL 25 MG PO CAPS: 25.0000 mg | ORAL_CAPSULE | ORAL | Status: DC | PRN

## 2020-12-02 MED ORDER — KETOROLAC TROMETHAMINE 30 MG/ML IJ SOLN
30.0000 mg | Freq: Four times a day (QID) | INTRAMUSCULAR | Status: DC | PRN
  Administered 2020-12-02: 30 mg via INTRAVENOUS

## 2020-12-02 MED ORDER — PHENYLEPHRINE HCL-NACL 20-0.9 MG/250ML-% IV SOLN
INTRAVENOUS | Status: AC
  Filled 2020-12-02: qty 250

## 2020-12-02 MED ORDER — PHENYLEPHRINE HCL-NACL 20-0.9 MG/250ML-% IV SOLN
INTRAVENOUS | Status: DC | PRN
  Administered 2020-12-02: 60 ug/min via INTRAVENOUS

## 2020-12-02 MED ORDER — NALBUPHINE HCL 10 MG/ML IJ SOLN: 5.0000 mg | Freq: Once | INTRAMUSCULAR | Status: DC | PRN

## 2020-12-02 MED ORDER — SIMETHICONE 80 MG PO CHEW: 80.0000 mg | CHEWABLE_TABLET | ORAL | Status: DC | PRN

## 2020-12-02 MED ORDER — OXYCODONE HCL 5 MG PO TABS: 5.0000 mg | ORAL_TABLET | ORAL | Status: DC | PRN

## 2020-12-02 MED ORDER — ONDANSETRON HCL 4 MG/2ML IJ SOLN
4.0000 mg | Freq: Three times a day (TID) | INTRAMUSCULAR | Status: DC | PRN
  Filled 2020-12-02: qty 2

## 2020-12-02 MED ORDER — NALOXONE HCL 0.4 MG/ML IJ SOLN: 0.4000 mg | INTRAMUSCULAR | Status: DC | PRN

## 2020-12-02 MED ORDER — MORPHINE SULFATE (PF) 0.5 MG/ML IJ SOLN
INTRAMUSCULAR | Status: DC | PRN
  Administered 2020-12-02: .15 mg via INTRATHECAL

## 2020-12-02 MED ORDER — TETANUS-DIPHTH-ACELL PERTUSSIS 5-2.5-18.5 LF-MCG/0.5 IM SUSY: 0.5000 mL | PREFILLED_SYRINGE | Freq: Once | INTRAMUSCULAR | Status: DC

## 2020-12-02 MED ORDER — MENTHOL 3 MG MT LOZG: 1.0000 | LOZENGE | OROMUCOSAL | Status: DC | PRN

## 2020-12-02 MED ORDER — SENNOSIDES-DOCUSATE SODIUM 8.6-50 MG PO TABS
2.0000 | ORAL_TABLET | Freq: Every day | ORAL | Status: DC
  Administered 2020-12-03 – 2020-12-05 (×3): 2 via ORAL
  Filled 2020-12-02 (×3): qty 2

## 2020-12-02 MED ORDER — MORPHINE SULFATE (PF) 0.5 MG/ML IJ SOLN
INTRAMUSCULAR | Status: AC
  Filled 2020-12-02: qty 10

## 2020-12-02 MED ORDER — NALBUPHINE HCL 10 MG/ML IJ SOLN: 5.0000 mg | INTRAMUSCULAR | Status: DC | PRN

## 2020-12-02 MED ORDER — DIPHENHYDRAMINE HCL 25 MG PO CAPS
25.0000 mg | ORAL_CAPSULE | Freq: Four times a day (QID) | ORAL | Status: DC | PRN
  Administered 2020-12-03: 25 mg via ORAL
  Filled 2020-12-02: qty 1

## 2020-12-02 MED ORDER — SODIUM CHLORIDE 0.9% FLUSH: 3.0000 mL | INTRAVENOUS | Status: DC | PRN

## 2020-12-02 MED ORDER — SODIUM CHLORIDE 0.9 % IV SOLN: 3.0000 g | Freq: Four times a day (QID) | INTRAVENOUS | Status: DC

## 2020-12-02 MED ORDER — FENTANYL CITRATE (PF) 100 MCG/2ML IJ SOLN: 25.0000 ug | INTRAMUSCULAR | Status: DC | PRN

## 2020-12-02 MED ORDER — NALOXONE HCL 4 MG/10ML IJ SOLN
1.0000 ug/kg/h | INTRAVENOUS | Status: DC | PRN
  Filled 2020-12-02: qty 5

## 2020-12-02 MED ORDER — KETOROLAC TROMETHAMINE 30 MG/ML IJ SOLN
INTRAMUSCULAR | Status: AC
  Filled 2020-12-02: qty 1

## 2020-12-02 MED ORDER — BUPIVACAINE IN DEXTROSE 0.75-8.25 % IT SOLN
INTRATHECAL | Status: DC | PRN
Start: 2020-12-02 — End: 2020-12-02
  Administered 2020-12-02: 1.6 mg via INTRATHECAL

## 2020-12-02 MED ORDER — WITCH HAZEL-GLYCERIN EX PADS: 1.0000 "application " | MEDICATED_PAD | CUTANEOUS | Status: DC | PRN

## 2020-12-02 MED ORDER — MAGNESIUM SULFATE 40 GM/1000ML IV SOLN
2.0000 g/h | INTRAVENOUS | Status: DC
  Administered 2020-12-03 – 2020-12-04 (×2): 2 g/h via INTRAVENOUS
  Filled 2020-12-02 (×2): qty 1000

## 2020-12-02 MED ORDER — SIMETHICONE 80 MG PO CHEW
80.0000 mg | CHEWABLE_TABLET | Freq: Three times a day (TID) | ORAL | Status: DC
  Administered 2020-12-03 – 2020-12-06 (×9): 80 mg via ORAL
  Filled 2020-12-02 (×9): qty 1

## 2020-12-02 MED ORDER — FENTANYL CITRATE (PF) 100 MCG/2ML IJ SOLN
INTRAMUSCULAR | Status: DC | PRN
  Administered 2020-12-02: 15 ug via INTRATHECAL

## 2020-12-02 MED ORDER — SODIUM CHLORIDE 0.9 % IV SOLN
INTRAVENOUS | Status: DC | PRN
  Administered 2020-12-02: 3 g via INTRAVENOUS

## 2020-12-02 MED ORDER — OXYTOCIN-SODIUM CHLORIDE 30-0.9 UT/500ML-% IV SOLN
INTRAVENOUS | Status: DC | PRN
  Administered 2020-12-02: 300 mL via INTRAVENOUS

## 2020-12-02 MED ORDER — KETOROLAC TROMETHAMINE 30 MG/ML IJ SOLN: 30.0000 mg | Freq: Four times a day (QID) | INTRAMUSCULAR | Status: DC | PRN

## 2020-12-02 MED ORDER — OXYTOCIN-SODIUM CHLORIDE 30-0.9 UT/500ML-% IV SOLN
INTRAVENOUS | Status: AC
  Filled 2020-12-02: qty 500

## 2020-12-02 MED ORDER — ONDANSETRON HCL 4 MG/2ML IJ SOLN
INTRAMUSCULAR | Status: AC
  Filled 2020-12-02: qty 2

## 2020-12-02 MED ORDER — MAGNESIUM SULFATE BOLUS VIA INFUSION
4.0000 g | Freq: Once | INTRAVENOUS | Status: AC
  Administered 2020-12-02: 4 g via INTRAVENOUS
  Filled 2020-12-02: qty 1000

## 2020-12-02 MED ORDER — KETOROLAC TROMETHAMINE 30 MG/ML IJ SOLN: 30.0000 mg | Freq: Once | INTRAMUSCULAR | Status: DC | PRN

## 2020-12-02 MED ORDER — SODIUM CHLORIDE 0.9 % IV SOLN
3.0000 g | Freq: Once | INTRAVENOUS | Status: DC
  Filled 2020-12-02: qty 8

## 2020-12-02 MED ORDER — DEXAMETHASONE SODIUM PHOSPHATE 4 MG/ML IJ SOLN
INTRAMUSCULAR | Status: AC
  Filled 2020-12-02: qty 1

## 2020-12-02 MED ORDER — SODIUM CHLORIDE 0.9 % IV SOLN
3.0000 g | Freq: Four times a day (QID) | INTRAVENOUS | Status: DC
  Administered 2020-12-02 – 2020-12-04 (×6): 3 g via INTRAVENOUS
  Filled 2020-12-02 (×6): qty 8

## 2020-12-02 MED ORDER — FENTANYL CITRATE (PF) 100 MCG/2ML IJ SOLN
INTRAMUSCULAR | Status: AC
  Filled 2020-12-02: qty 2

## 2020-12-02 MED ORDER — ONDANSETRON HCL 4 MG/2ML IJ SOLN
INTRAMUSCULAR | Status: DC | PRN
  Administered 2020-12-02: 4 mg via INTRAVENOUS

## 2020-12-02 MED ORDER — ACETAMINOPHEN 500 MG PO TABS: 1000.0000 mg | ORAL_TABLET | Freq: Four times a day (QID) | ORAL | Status: DC

## 2020-12-02 MED ORDER — COCONUT OIL OIL
1.0000 "application " | TOPICAL_OIL | Status: DC | PRN
  Administered 2020-12-05: 1 via TOPICAL

## 2020-12-02 MED ORDER — ZOLPIDEM TARTRATE 5 MG PO TABS: 5.0000 mg | ORAL_TABLET | Freq: Every evening | ORAL | Status: DC | PRN

## 2020-12-02 MED ORDER — DIBUCAINE (PERIANAL) 1 % EX OINT: 1.0000 "application " | TOPICAL_OINTMENT | CUTANEOUS | Status: DC | PRN

## 2020-12-02 MED ORDER — ACETAMINOPHEN 500 MG PO TABS
1000.0000 mg | ORAL_TABLET | Freq: Four times a day (QID) | ORAL | Status: DC
  Administered 2020-12-02 – 2020-12-06 (×12): 1000 mg via ORAL
  Filled 2020-12-02 (×14): qty 2

## 2020-12-02 MED ORDER — HYDROMORPHONE HCL 1 MG/ML IJ SOLN: 0.2000 mg | INTRAMUSCULAR | Status: DC | PRN

## 2020-12-02 MED ORDER — SCOPOLAMINE 1 MG/3DAYS TD PT72
1.0000 | MEDICATED_PATCH | Freq: Once | TRANSDERMAL | Status: AC
  Administered 2020-12-02: 1.5 mg via TRANSDERMAL
  Filled 2020-12-02: qty 1

## 2020-12-02 MED ORDER — DIPHENHYDRAMINE HCL 50 MG/ML IJ SOLN: 12.5000 mg | INTRAMUSCULAR | Status: DC | PRN

## 2020-12-02 SURGICAL SUPPLY — 33 items
BARRIER ADHS 3X4 INTERCEED (GAUZE/BANDAGES/DRESSINGS) IMPLANT
BENZOIN TINCTURE PRP APPL 2/3 (GAUZE/BANDAGES/DRESSINGS) ×2 IMPLANT
CHLORAPREP W/TINT 26ML (MISCELLANEOUS) ×2 IMPLANT
CLAMP CORD UMBIL (MISCELLANEOUS) IMPLANT
CLOTH BEACON ORANGE TIMEOUT ST (SAFETY) ×2 IMPLANT
DRSG OPSITE POSTOP 4X10 (GAUZE/BANDAGES/DRESSINGS) ×2 IMPLANT
ELECT REM PT RETURN 9FT ADLT (ELECTROSURGICAL) ×2
ELECTRODE REM PT RTRN 9FT ADLT (ELECTROSURGICAL) ×1 IMPLANT
EXTRACTOR VACUUM M CUP 4 TUBE (SUCTIONS) IMPLANT
GLOVE BIO SURGEON STRL SZ 6.5 (GLOVE) ×2 IMPLANT
GLOVE BIOGEL PI IND STRL 7.0 (GLOVE) ×1 IMPLANT
GLOVE BIOGEL PI INDICATOR 7.0 (GLOVE) ×1
GOWN STRL REUS W/TWL LRG LVL3 (GOWN DISPOSABLE) ×4 IMPLANT
KIT ABG SYR 3ML LUER SLIP (SYRINGE) IMPLANT
NEEDLE HYPO 22GX1.5 SAFETY (NEEDLE) IMPLANT
NEEDLE HYPO 25X5/8 SAFETYGLIDE (NEEDLE) ×2 IMPLANT
NS IRRIG 1000ML POUR BTL (IV SOLUTION) ×2 IMPLANT
PACK C SECTION WH (CUSTOM PROCEDURE TRAY) ×2 IMPLANT
PAD ABD DERMACEA PRESS 5X9 (GAUZE/BANDAGES/DRESSINGS) ×2 IMPLANT
PAD OB MATERNITY 4.3X12.25 (PERSONAL CARE ITEMS) ×2 IMPLANT
PENCIL SMOKE EVAC W/HOLSTER (ELECTROSURGICAL) ×2 IMPLANT
STRIP SURGICAL 1/4 X 6 IN (GAUZE/BANDAGES/DRESSINGS) ×2 IMPLANT
SUT CHROMIC 0 CTX 36 (SUTURE) ×4 IMPLANT
SUT PLAIN 0 NONE (SUTURE) IMPLANT
SUT PLAIN 2 0 XLH (SUTURE) IMPLANT
SUT VIC AB 0 CT1 27 (SUTURE) ×3
SUT VIC AB 0 CT1 27XBRD ANBCTR (SUTURE) ×3 IMPLANT
SUT VIC AB 4-0 KS 27 (SUTURE) IMPLANT
SYR CONTROL 10ML LL (SYRINGE) IMPLANT
TOWEL OR 17X24 6PK STRL BLUE (TOWEL DISPOSABLE) ×2 IMPLANT
TRAY FOLEY W/BAG SLVR 14FR LF (SET/KITS/TRAYS/PACK) ×2 IMPLANT
VACUUM CUP M-STYLE MYSTIC II (SUCTIONS) IMPLANT
WATER STERILE IRR 1000ML POUR (IV SOLUTION) ×2 IMPLANT

## 2020-12-02 NOTE — Progress Notes (Signed)
Continues to feel tightness across abdomen and malodorous AF No bleeding, no HA, no vision change  Today's Vitals   12/02/20 1444 12/02/20 1525 12/02/20 1529 12/02/20 1600  BP:   138/82 (!) 150/84  Pulse:   80 92  Resp:      Temp:    98.9 F (37.2 C)  TempSrc:    Oral  SpO2:      Weight:      Height:      PainSc: 3  3      Body mass index is 48.36 kg/m.   Abdomen-uterus mild tenderness DTR 2+  Results for orders placed or performed during the hospital encounter of 11/28/20 (from the past 24 hour(s))  Glucose, capillary     Status: None   Collection Time: 12/01/20  8:01 PM  Result Value Ref Range   Glucose-Capillary 94 70 - 99 mg/dL  Glucose, capillary     Status: None   Collection Time: 12/02/20  7:21 AM  Result Value Ref Range   Glucose-Capillary 89 70 - 99 mg/dL  Glucose, capillary     Status: None   Collection Time: 12/02/20 10:39 AM  Result Value Ref Range   Glucose-Capillary 90 70 - 99 mg/dL  Type and screen [facility]     Status: None   Collection Time: 12/02/20 11:15 AM  Result Value Ref Range   ABO/RH(D) A POS    Antibody Screen NEG    Sample Expiration      12/05/2020,2359 Performed at [facility], [address], [place], [place] [address]   Glucose, capillary     Status: None   Collection Time: 12/02/20  1:56 PM  Result Value Ref Range   Glucose-Capillary 88 70 - 99 mg/dL   Comment 1 Notify RN    Comment 2 Document in Chart   CBC     Status: Abnormal   Collection Time: 12/02/20  2:28 PM  Result Value Ref Range   WBC 12.1 (H) 4.0 - 10.5 K/uL   RBC 3.47 (L) 3.87 - 5.11 MIL/uL   Hemoglobin 10.5 (L) 12.0 - 15.0 g/dL   HCT 81.2 (L) 75.1 - 70.0 %   MCV 91.1 80.0 - 100.0 fL   MCH 30.3 26.0 - 34.0 pg   MCHC 33.2 30.0 - 36.0 g/dL   RDW 17.4 94.4 - 96.7 %   Platelets 399 150 - 400 K/uL   nRBC 0.0 0.0 - 0.2 %  Comprehensive metabolic panel     Status: Abnormal   Collection Time: 12/02/20  2:28 PM  Result Value Ref Range    Sodium 138 135 - 145 mmol/L   Potassium 3.8 3.5 - 5.1 mmol/L   Chloride 106 98 - 111 mmol/L   CO2 22 22 - 32 mmol/L   Glucose, Bld 95 70 - 99 mg/dL   BUN 13 6 - 20 mg/dL   Creatinine, Ser 5.91 0.44 - 1.00 mg/dL   Calcium 9.5 8.9 - 63.8 mg/dL   Total Protein 5.9 (L) 6.5 - 8.1 g/dL   Albumin 2.6 (L) 3.5 - 5.0 g/dL   AST 40 15 - 41 U/L   ALT 92 (H) 0 - 44 U/L   Alkaline Phosphatase 98 38 - 126 U/L   Total Bilirubin 0.6 0.3 - 1.2 mg/dL   GFR, Estimated >46 >65 mL/min   Anion gap 10 5 - 15  Glucose, capillary     Status: None   Collection Time: 12/02/20  4:33 PM  Result Value Ref Range   Glucose-Capillary 81 70 - 99 mg/dL     A/P: Chorioamnionitis         Preeclampsia - BPs elevated and now elevated LFT          D/W patient and husband. I recommend delivery. D/W cesarean section and risks including infection, organ damage, bleeding/transfusion-HIV/Hep, DVT/PE, pneumonia, wound breakdown. Will begin Unasyn. She states she understands and agrees.

## 2020-12-02 NOTE — Anesthesia Procedure Notes (Signed)
Spinal  Patient location during procedure: OR Start time: 12/02/2020 5:10 PM End time: 12/02/2020 5:15 PM Reason for block: surgical anesthesia Staffing Performed: anesthesiologist  Anesthesiologist: Elmer Picker, MD Preanesthetic Checklist Completed: patient identified, IV checked, risks and benefits discussed, surgical consent, monitors and equipment checked, pre-op evaluation and timeout performed Spinal Block Patient position: sitting Prep: DuraPrep and site prepped and draped Patient monitoring: cardiac monitor, continuous pulse ox and blood pressure Approach: midline Location: L3-4 Injection technique: single-shot Needle Needle type: Pencan  Needle gauge: 24 G Needle length: 9 cm Assessment Sensory level: T6 Events: CSF return Additional Notes Functioning IV was confirmed and monitors were applied. Sterile prep and drape, including hand hygiene and sterile gloves were used. The patient was positioned and the spine was prepped. The skin was anesthetized with lidocaine.  Free flow of clear CSF was obtained prior to injecting local anesthetic into the CSF.  The spinal needle aspirated freely following injection.  The needle was carefully withdrawn.  The patient tolerated the procedure well.

## 2020-12-02 NOTE — Brief Op Note (Signed)
12/02/2020  6:29 PM  PATIENT:  [name]  35 y.o. female  PRE-OPERATIVE DIAGNOSIS:  Cesarean Section-Premature rupture of membranes-chorio and GHTN  POST-OPERATIVE DIAGNOSIS:  Cesarean Section-Premature rupture of membranes-chorio and GHTN  PROCEDURE:  Procedure(s): CESAREAN SECTION (N/A)  SURGEON:  Surgeon(s) and Role:    * Harold Hedge, MD - Primary  PHYSICIAN ASSISTANT:   ASSISTANTS: none   ANESTHESIA:   spinal  EBL:  505 ml   BLOOD ADMINISTERED:none  DRAINS: Urinary Catheter (Foley)   LOCAL MEDICATIONS USED:  NONE  SPECIMEN:  Source of Specimen:  placenta  DISPOSITION OF SPECIMEN:  PATHOLOGY  COUNTS:  YES  TOURNIQUET:  * No tourniquets in log *  DICTATION: [id number]  PLAN OF CARE: Admit to inpatient   PATIENT DISPOSITION:  PACU - hemodynamically stable.   Delay start of Pharmacological VTE agent (>24hrs) due to surgical blood loss or risk of bleeding: not applicable

## 2020-12-02 NOTE — Transfer of Care (Signed)
Immediate Anesthesia Transfer of Care Note  Patient: [name]  Procedure(s) Performed: CESAREAN SECTION  Patient Location: PACU  Anesthesia Type:Epidural  Level of Consciousness: awake  Airway & Oxygen Therapy: Patient Spontanous Breathing  Post-op Assessment: Report given to RN  Post vital signs: Reviewed and stable  Last Vitals:  Vitals Value Taken Time  BP    Temp    Pulse    Resp    SpO2      Last Pain:  Vitals:   12/02/20 1600  TempSrc: Oral  PainSc:       Patients Stated Pain Goal: 4 (12/02/20 0830)  Complications: No notable events documented.

## 2020-12-02 NOTE — Anesthesia Preprocedure Evaluation (Addendum)
Anesthesia Evaluation    Reviewed: Allergy & Precautions, Patient's Chart, lab work & pertinent test results  Airway Mallampati: III  TM Distance: >3 FB Neck ROM: Full    Dental no notable dental hx.    Pulmonary neg pulmonary ROS,    Pulmonary exam normal breath sounds clear to auscultation       Cardiovascular hypertension (preE), Normal cardiovascular exam Rhythm:Regular Rate:Normal     Neuro/Psych negative neurological ROS  negative psych ROS   GI/Hepatic negative GI ROS, Neg liver ROS,   Endo/Other  diabetes, GestationalMorbid obesity (BMI 48)  Renal/GU negative Renal ROS  negative genitourinary   Musculoskeletal negative musculoskeletal ROS (+)   Abdominal   Peds  Hematology  (+) Blood dyscrasia (Hgb 10.5), anemia ,   Anesthesia Other Findings Primary C/S for preE and chorio. Preg also c/b PPROM  Reproductive/Obstetrics                           Anesthesia Physical Anesthesia Plan  ASA: 3  Anesthesia Plan: Spinal   Post-op Pain Management:    Induction:   PONV Risk Score and Plan: Treatment may vary due to age or medical condition  Airway Management Planned: Natural Airway  Additional Equipment:   Intra-op Plan:   Post-operative Plan:   Informed Consent: I have reviewed the patients History and Physical, chart, labs and discussed the procedure including the risks, benefits and alternatives for the proposed anesthesia with the patient or authorized representative who has indicated his/her understanding and acceptance.     Dental advisory given  Plan Discussed with: CRNA  Anesthesia Plan Comments:         Anesthesia Quick Evaluation

## 2020-12-02 NOTE — Progress Notes (Signed)
34 3/7 wks  Some leaking through the day-clear to yellow No UCs   Today's Vitals   12/01/20 1910 12/01/20 2156 12/02/20 0723 12/02/20 0724  BP:    128/82  Pulse:    86  Resp:    18  Temp:    97.9 F (36.6 C)  TempSrc:      SpO2:    98%  Weight:      Height:      PainSc: 1  Asleep 5     Body mass index is 48.36 kg/m.   Uterus NT, soft  NST + accels          UCs none  Results for orders placed or performed during the hospital encounter of 11/28/20 (from the past 24 hour(s))  Glucose, capillary     Status: None   Collection Time: 12/01/20 10:00 AM  Result Value Ref Range   Glucose-Capillary 99 70 - 99 mg/dL  CBC with Differential/Platelet     Status: Abnormal   Collection Time: 12/01/20 10:36 AM  Result Value Ref Range   WBC 11.9 (H) 4.0 - 10.5 K/uL   RBC 3.37 (L) 3.87 - 5.11 MIL/uL   Hemoglobin 10.3 (L) 12.0 - 15.0 g/dL   HCT 36.1 (L) 44.3 - 15.4 %   MCV 91.7 80.0 - 100.0 fL   MCH 30.6 26.0 - 34.0 pg   MCHC 33.3 30.0 - 36.0 g/dL   RDW 00.8 67.6 - 19.5 %   Platelets 382 150 - 400 K/uL   nRBC 0.0 0.0 - 0.2 %   Neutrophils Relative % 77 %   Neutro Abs 9.2 (H) 1.7 - 7.7 K/uL   Lymphocytes Relative 15 %   Lymphs Abs 1.8 0.7 - 4.0 K/uL   Monocytes Relative 7 %   Monocytes Absolute 0.8 0.1 - 1.0 K/uL   Eosinophils Relative 0 %   Eosinophils Absolute 0.0 0.0 - 0.5 K/uL   Basophils Relative 0 %   Basophils Absolute 0.0 0.0 - 0.1 K/uL   Immature Granulocytes 1 %   Abs Immature Granulocytes 0.14 (H) 0.00 - 0.07 K/uL  Glucose, capillary     Status: None   Collection Time: 12/01/20  2:56 PM  Result Value Ref Range   Glucose-Capillary 95 70 - 99 mg/dL  Glucose, capillary     Status: None   Collection Time: 12/01/20  8:01 PM  Result Value Ref Range   Glucose-Capillary 94 70 - 99 mg/dL  Glucose, capillary     Status: None   Collection Time: 12/02/20  7:21 AM  Result Value Ref Range   Glucose-Capillary 89 70 - 99 mg/dL     A/P: PPROM         Latency ATB          BMZ done         GBBS positive         NICU consult done         A1GDM         Good control         C/S for delivery due to low back pain          Will schedule

## 2020-12-02 NOTE — Progress Notes (Signed)
This morning she noticed fluid had a change in odor. This persists and now feels more tightness across abdomen. Continues to leak, no blood. No HA or vision change.  Today's Vitals   12/02/20 0724 12/02/20 0810 12/02/20 0830 12/02/20 1149  BP: 128/82   (!) 148/94  Pulse: 86   92  Resp: 18   18  Temp: 97.9 F (36.6 C)   98.8 F (37.1 C)  TempSrc:    Oral  SpO2: 98%   95%  Weight:      Height:      PainSc:  2  2     Body mass index is 48.36 kg/m.   Abdomen - mild tenderness across fundus that is a change from this morning.  A/P: D/W patient recent elevated BP (this followed a trip to [place] when her back was giving her pain)         Also now has some uterine tenderness and malodorous AF.         CBC, CMET just drawn         NPO, IV fluids         D/W possible C/S today. Will monitor closely.

## 2020-12-03 DIAGNOSIS — Z20822 Contact with and (suspected) exposure to covid-19: Secondary | ICD-10-CM | POA: Diagnosis not present

## 2020-12-03 DIAGNOSIS — O99214 Obesity complicating childbirth: Secondary | ICD-10-CM | POA: Diagnosis not present

## 2020-12-03 DIAGNOSIS — G8929 Other chronic pain: Secondary | ICD-10-CM | POA: Diagnosis not present

## 2020-12-03 DIAGNOSIS — O411230 Chorioamnionitis, third trimester, not applicable or unspecified: Secondary | ICD-10-CM | POA: Diagnosis not present

## 2020-12-03 DIAGNOSIS — O24420 Gestational diabetes mellitus in childbirth, diet controlled: Secondary | ICD-10-CM | POA: Diagnosis not present

## 2020-12-03 DIAGNOSIS — O99892 Other specified diseases and conditions complicating childbirth: Secondary | ICD-10-CM | POA: Diagnosis not present

## 2020-12-03 DIAGNOSIS — O1494 Unspecified pre-eclampsia, complicating childbirth: Secondary | ICD-10-CM | POA: Diagnosis not present

## 2020-12-03 DIAGNOSIS — O42913 Preterm premature rupture of membranes, unspecified as to length of time between rupture and onset of labor, third trimester: Secondary | ICD-10-CM | POA: Diagnosis not present

## 2020-12-03 LAB — CBC
HCT: 31.1 % — ABNORMAL LOW (ref 36.0–46.0)
Hemoglobin: 10.4 g/dL — ABNORMAL LOW (ref 12.0–15.0)
MCH: 30.3 pg (ref 26.0–34.0)
MCHC: 33.4 g/dL (ref 30.0–36.0)
MCV: 90.7 fL (ref 80.0–100.0)
Platelets: 399 10*3/uL (ref 150–400)
RBC: 3.43 MIL/uL — ABNORMAL LOW (ref 3.87–5.11)
RDW: 13.2 % (ref 11.5–15.5)
WBC: 19.8 10*3/uL — ABNORMAL HIGH (ref 4.0–10.5)
nRBC: 0 % (ref 0.0–0.2)

## 2020-12-03 LAB — COMPREHENSIVE METABOLIC PANEL
ALT: 129 U/L — ABNORMAL HIGH (ref 0–44)
AST: 45 U/L — ABNORMAL HIGH (ref 15–41)
Albumin: 2.5 g/dL — ABNORMAL LOW (ref 3.5–5.0)
Alkaline Phosphatase: 101 U/L (ref 38–126)
Anion gap: 7 (ref 5–15)
BUN: 13 mg/dL (ref 6–20)
CO2: 21 mmol/L — ABNORMAL LOW (ref 22–32)
Calcium: 8.3 mg/dL — ABNORMAL LOW (ref 8.9–10.3)
Chloride: 103 mmol/L (ref 98–111)
Creatinine, Ser: 0.45 mg/dL (ref 0.44–1.00)
GFR, Estimated: >60 mL/min (ref >60)
Glucose, Bld: 131 mg/dL — ABNORMAL HIGH (ref 70–99)
Potassium: 4 mmol/L (ref 3.5–5.1)
Sodium: 131 mmol/L — ABNORMAL LOW (ref 135–145)
Total Bilirubin: 0.5 mg/dL (ref 0.3–1.2)
Total Protein: 5.8 g/dL — ABNORMAL LOW (ref 6.5–8.1)

## 2020-12-03 LAB — MAGNESIUM: Magnesium: 3.8 mg/dL — ABNORMAL HIGH (ref 1.7–2.4)

## 2020-12-03 MED ORDER — CYCLOBENZAPRINE HCL 10 MG PO TABS
10.0000 mg | ORAL_TABLET | Freq: Three times a day (TID) | ORAL | Status: DC | PRN
  Administered 2020-12-03 – 2020-12-05 (×3): 10 mg via ORAL
  Filled 2020-12-03 (×4): qty 1

## 2020-12-03 MED ORDER — IBUPROFEN 600 MG PO TABS
600.0000 mg | ORAL_TABLET | Freq: Four times a day (QID) | ORAL | Status: DC | PRN
  Administered 2020-12-03 – 2020-12-06 (×11): 600 mg via ORAL
  Filled 2020-12-03 (×12): qty 1

## 2020-12-03 NOTE — Lactation Note (Signed)
This note was copied from a baby's chart. Lactation Consultation Note  Patient Name: [name] [id number] Date: 12/03/2020   Age:37 hours  This LC issued a [employer] employee pump, GOB also requested extra # 24 flanges which were also provided. No further questions or concerns at this time, family to call NICU LC PRN.   [name] S [name] 12/03/2020, 6:56 PM

## 2020-12-03 NOTE — Progress Notes (Signed)
POD # 1  C Section  PPROM  - Chorioamnionitis Preeclampsia - Elevated LFTs  S:  Patient currently in NICU visiting with the baby   O:  BP 132/70   Pulse 91   Temp 97.8 F (36.6 C) (Oral)   Resp 18   Ht 5\' 3"  (1.6 m)   Wt 123.8 kg   SpO2 95%   Breastfeeding Unknown   BMI 48.36 kg/m  Results for orders placed or performed during the hospital encounter of 11/28/20 (from the past 24 hour(s))  Glucose, capillary     Status: None   Collection Time: 12/02/20 10:39 AM  Result Value Ref Range   Glucose-Capillary 90 70 - 99 mg/dL  Type and screen [facility]     Status: None   Collection Time: 12/02/20 11:15 AM  Result Value Ref Range   ABO/RH(D) A POS    Antibody Screen NEG    Sample Expiration      12/05/2020,2359 Performed at [facility], [address] [place], [place]   Glucose, capillary     Status: None   Collection Time: 12/02/20  1:56 PM  Result Value Ref Range   Glucose-Capillary 88 70 - 99 mg/dL   Comment 1 Notify RN    Comment 2 Document in Chart   CBC     Status: Abnormal   Collection Time: 12/02/20  2:28 PM  Result Value Ref Range   WBC 12.1 (H) 4.0 - 10.5 K/uL   RBC 3.47 (L) 3.87 - 5.11 MIL/uL   Hemoglobin 10.5 (L) 12.0 - 15.0 g/dL   HCT 12/04/20 (L) 92.4 - 26.8 %   MCV 91.1 80.0 - 100.0 fL   MCH 30.3 26.0 - 34.0 pg   MCHC 33.2 30.0 - 36.0 g/dL   RDW 34.1 96.2 - 22.9 %   Platelets 399 150 - 400 K/uL   nRBC 0.0 0.0 - 0.2 %  Comprehensive metabolic panel     Status: Abnormal   Collection Time: 12/02/20  2:28 PM  Result Value Ref Range   Sodium 138 135 - 145 mmol/L   Potassium 3.8 3.5 - 5.1 mmol/L   Chloride 106 98 - 111 mmol/L   CO2 22 22 - 32 mmol/L   Glucose, Bld 95 70 - 99 mg/dL   BUN 13 6 - 20 mg/dL   Creatinine, Ser 12/04/20 0.44 - 1.00 mg/dL   Calcium 9.5 8.9 - 9.21 mg/dL   Total Protein 5.9 (L) 6.5 - 8.1 g/dL   Albumin 2.6 (L) 3.5 - 5.0 g/dL   AST 40 15 - 41 U/L   ALT 92 (H) 0 - 44 U/L   Alkaline Phosphatase 98 38  - 126 U/L   Total Bilirubin 0.6 0.3 - 1.2 mg/dL   GFR, Estimated 19.4 >17 mL/min   Anion gap 10 5 - 15  Glucose, capillary     Status: None   Collection Time: 12/02/20  4:33 PM  Result Value Ref Range   Glucose-Capillary 81 70 - 99 mg/dL  Glucose, capillary     Status: None   Collection Time: 12/02/20  7:17 PM  Result Value Ref Range   Glucose-Capillary 95 70 - 99 mg/dL  CBC     Status: Abnormal   Collection Time: 12/03/20  5:07 AM  Result Value Ref Range   WBC 19.8 (H) 4.0 - 10.5 K/uL   RBC 3.43 (L) 3.87 - 5.11 MIL/uL   Hemoglobin 10.4 (L) 12.0 - 15.0 g/dL  HCT 31.1 (L) 36.0 - 46.0 %   MCV 90.7 80.0 - 100.0 fL   MCH 30.3 26.0 - 34.0 pg   MCHC 33.4 30.0 - 36.0 g/dL   RDW 21.3 08.6 - 57.8 %   Platelets 399 150 - 400 K/uL   nRBC 0.0 0.0 - 0.2 %  Comprehensive metabolic panel     Status: Abnormal   Collection Time: 12/03/20  5:07 AM  Result Value Ref Range   Sodium 131 (L) 135 - 145 mmol/L   Potassium 4.0 3.5 - 5.1 mmol/L   Chloride 103 98 - 111 mmol/L   CO2 21 (L) 22 - 32 mmol/L   Glucose, Bld 131 (H) 70 - 99 mg/dL   BUN 13 6 - 20 mg/dL   Creatinine, Ser 4.69 0.44 - 1.00 mg/dL   Calcium 8.3 (L) 8.9 - 10.3 mg/dL   Total Protein 5.8 (L) 6.5 - 8.1 g/dL   Albumin 2.5 (L) 3.5 - 5.0 g/dL   AST 45 (H) 15 - 41 U/L   ALT 129 (H) 0 - 44 U/L   Alkaline Phosphatase 101 38 - 126 U/L   Total Bilirubin 0.5 0.3 - 1.2 mg/dL   GFR, Estimated >62 >95 mL/min   Anion gap 7 5 - 15  Magnesium     Status: Abnormal   Collection Time: 12/03/20  5:07 AM  Result Value Ref Range   Magnesium 3.8 (H) 1.7 - 2.4 mg/dL   Scheduled Meds:  acetaminophen  1,000 mg Oral Q6H   prenatal multivitamin  1 tablet Oral Q1200   scopolamine  1 patch Transdermal Once   senna-docusate  2 tablet Oral Daily   simethicone  80 mg Oral TID PC   Tdap  0.5 mL Intramuscular Once   Continuous Infusions:  ampicillin-sulbactam (UNASYN) 3 g IVPB (Mini-Bag Plus) 3 g (12/03/20 0541)   lactated ringers 75 mL/hr at 12/02/20  2330   magnesium sulfate 2 g/hr (12/02/20 1928)   naLOXone (NARCAN) adult infusion for PRURITIS     oxytocin Stopped (12/02/20 2330)   PRN Meds:.coconut oil, witch hazel-glycerin **AND** dibucaine, diphenhydrAMINE **OR** diphenhydrAMINE, diphenhydrAMINE, HYDROmorphone (DILAUDID) injection, ketorolac **OR** ketorolac, menthol-cetylpyridinium, nalbuphine **OR** nalbuphine, nalbuphine **OR** nalbuphine, naloxone **AND** sodium chloride flush, naLOXone (NARCAN) adult infusion for PRURITIS, ondansetron (ZOFRAN) IV, oxyCODONE, simethicone, zolpidem  Abdomen is soft and non tender Bandage clean and dry   POD # 1  Above  Continue Magnesium until tomorrow am - repeat CBC and CMET and consider discharge of Magnesium then Unasyn - continue until tomorrow

## 2020-12-03 NOTE — Anesthesia Postprocedure Evaluation (Signed)
Anesthesia Post Note  Patient: [name]  Procedure(s) Performed: CESAREAN SECTION     Patient location during evaluation: PACU Anesthesia Type: Spinal Level of consciousness: oriented and awake and alert Pain management: pain level controlled Vital Signs Assessment: post-procedure vital signs reviewed and stable Respiratory status: spontaneous breathing, respiratory function stable and patient connected to nasal cannula oxygen Cardiovascular status: blood pressure returned to baseline and stable Postop Assessment: no headache, no backache and no apparent nausea or vomiting Anesthetic complications: no   No notable events documented.  Last Vitals:  Vitals:   12/03/20 0400 12/03/20 0543  BP:  123/78  Pulse:  78  Resp: 16 17  Temp:  36.6 C  SpO2:  95%    Last Pain:  Vitals:   12/03/20 0543  TempSrc: Oral  PainSc:                  [name]

## 2020-12-03 NOTE — Op Note (Signed)
[name], [name] MEDICAL RECORD NO: [id number] ACCOUNT NO: [id number] DATE OF BIRTH: 1985/06/08 FACILITY: [facility] LOCATION: [facility] PHYSICIAN: Guy Sandifer. Arleta Creek, MD  Operative Report   DATE OF PROCEDURE: 12/02/2020  PREOPERATIVE DIAGNOSES:   1.  Intrauterine pregnancy at 34-3/7th weeks. 2.  Preterm premature rupture of membranes. 3.  Chorioamnionitis. 4.  Preeclampsia.  POSTOPERATIVE DIAGNOSES:   1.  Intrauterine pregnancy at 34-3/7th weeks. 2.  Preterm premature rupture of membranes. 3.  Chorioamnionitis. 4.  Preeclampsia.  PROCEDURE:  Primary low transverse cesarean section.  ASSISTANT:  Guy Sandifer. Henderson Cloud II, MD  ANESTHESIA:  Spinal.  ESTIMATED BLOOD LOSS:  505 mL  SPECIMENS:  Placenta to pathology.  FINDINGS:  Viable female infant, Apgars, arterial cord pH, birth weight pending.  INDICATIONS AND CONSENT:  This patient is a 35 year old G1 P0 at 82 and 3/7th weeks.  She was admitted to the hospital on 11/28/2020 with ruptured membranes.  She continued to leak clear fluid.  She was placed on latency antibiotics and was without signs  of infection.  She was given betamethasone on 10/27 and 10/28.  Today, fluid remains clear, but has a distinct change in odor to the patient.  In addition, as the day wore on, she developed tightness and mild tenderness over the uterus across her  abdomen.  She remained afebrile and white count today is 12.1.  However, her blood pressures became more labile and her ALT was elevated at 92. Out of concern for evolving chorioamnionitis and now preeclampsia, delivery is recommended.  The patient has a  long history of low back pain and requested primary cesarean section.  The procedure and risks were discussed preoperatively including but not limited to infection, organ damage, bleeding requiring transfusion of blood products with HIV and hepatitis  acquisition, DVT, PE, pneumonia and wound breakdown.  DESCRIPTION OF PROCEDURE:  The patient was  taken to the operating room where she was identified.  Spinal anesthetic was placed and she was placed in the dorsal supine position with a 15-degree left lateral wedge.  She was prepped vaginally with  ChloraPrep.  Foley catheter was placed.  Abdomen is elevated with a Traxi and she was prepped abdominally with ChloraPrep.  Timeout was done and after a 3-minute drying time, she was draped in a sterile fashion.  After testing for adequate spinal  anesthetic, skin was entered through a Pfannenstiel incision and dissection was carried out in layers to the peritoneum, which was entered bluntly and extended superiorly and inferiorly.  The vesicouterine peritoneum was taken down cephalad laterally and  a low transverse incision is made.  The uterine cavity was entered bluntly with a hemostat and extended with the fingers.  Vertex was then delivered and the baby was delivered without difficulty.  A good cry and tone is noted.  After 1 minute, cord was  clamped and cut and the baby was handed to the waiting pediatrics team.  Placenta was manually delivered and sent to pathology.  Uterine cavity is clean.  Uterus was closed in 2 running locking imbricating layers of 0 Monocryl suture, which achieved good  hemostasis.  Lavage was carried out and all returned as clear.  Anterior peritoneum was closed in running fashion with 0 Monocryl suture, which was also used to reapproximate the pyramidalis muscle in the midline.  The anterior rectus fascia was closed  in a running fashion with a 0 looped PDS.  Subcutaneous layer was closed in interrupted fashion with 2-0 plain and the skin was closed  in a subcuticular fashion with a 4-0 Vicryl on a Keith needle.  Benzoin, Steri-Strips, honeycomb and pressure dressing  was applied.  All counts were correct, and the patient was taken to the recovery room in stable condition.     PAA D: 12/02/2020 6:36:45 pm T: 12/03/2020 1:15:00 am  JOB: [id number]/ [id number]

## 2020-12-03 NOTE — Lactation Note (Signed)
This note was copied from a baby's chart. Lactation Consultation Note  Patient Name: [name] Date: 12/03/2020 Reason for consult: Early term 37-38.6wks;Maternal endocrine disorder;Initial assessment;Primapara;1st time breastfeeding;Other (Comment);Late-preterm 34-36.6wks (AMA) Age:37 hours  Visited with mom of 20 hours old LPI NICU female, she's a P1 and reported (+) breast changes during the pregnancy. Mom has a DEBP set up in her room, but she hasn't used it yet, she's been on Mag and not feeling at her best.   However, she's been diligent about hand expression and already getting some drops, every 3 hours, praised her for her efforts. [name], an experienced LC that has been helping mom with her pumping and BF questions.   Mom requested a feeding assist in the NICU at 2:45 pm but baby wouldn't wake up. LC undressed baby and try to aroused him but he was still asleep, an attempt was documented.   Reviewed pumping schedule, expectations, feeding cues, lactogenesis II and benefits of premature milk for NICU baby.  Plan of care:  Encouraged mom to start pumping ASAP, at least 8 times/24 hours She'll continue hand expressing every 3 hours prior pumping LC will issue [employer] employee pump to mom, FOB is a [employer] employee  BF brochure, BF resources and NICU booklet were reviewed. GOB and FOB present and very supportive. All questions and concerns answered, family to call NICU LC PRN.  Maternal Data Has patient been taught Hand Expression?: Yes Does the patient have breastfeeding experience prior to this delivery?: No  Feeding Mother's Current Feeding Choice: Breast Milk and Donor Milk  Lactation Tools Discussed/Used Tools: Pump;Flanges Flange Size: 24;21 Breast pump type: Double-Electric Breast Pump Pump Education: Setup, frequency, and cleaning;Milk Storage Reason for Pumping: LPI in NICU Pumping frequency: q 3 hours (recommended) Pumped volume:   (drops)  Discharge Pump: DEBP;Employee Pump (FOB is a Producer, television/film/video, mom is under her insurance and wants to get the [employer] employee pump.)  Consult Status Consult Status: Follow-up Date: 12/03/20 Follow-up type: In-patient   [name] 12/03/2020, 2:23 PM

## 2020-12-04 ENCOUNTER — Encounter (HOSPITAL_COMMUNITY): Payer: Self-pay | Admitting: Obstetrics and Gynecology

## 2020-12-04 LAB — COMPREHENSIVE METABOLIC PANEL
ALT: 114 U/L — ABNORMAL HIGH (ref 0–44)
AST: 28 U/L (ref 15–41)
Albumin: 2.1 g/dL — ABNORMAL LOW (ref 3.5–5.0)
Alkaline Phosphatase: 80 U/L (ref 38–126)
Anion gap: 7 (ref 5–15)
BUN: 8 mg/dL (ref 6–20)
CO2: 23 mmol/L (ref 22–32)
Calcium: 7.6 mg/dL — ABNORMAL LOW (ref 8.9–10.3)
Chloride: 104 mmol/L (ref 98–111)
Creatinine, Ser: 0.48 mg/dL (ref 0.44–1.00)
GFR, Estimated: >60 mL/min (ref >60)
Glucose, Bld: 104 mg/dL — ABNORMAL HIGH (ref 70–99)
Potassium: 3.4 mmol/L — ABNORMAL LOW (ref 3.5–5.1)
Sodium: 134 mmol/L — ABNORMAL LOW (ref 135–145)
Total Bilirubin: 0.6 mg/dL (ref 0.3–1.2)
Total Protein: 5.1 g/dL — ABNORMAL LOW (ref 6.5–8.1)

## 2020-12-04 LAB — CBC
HCT: 29.4 % — ABNORMAL LOW (ref 36.0–46.0)
Hemoglobin: 9.8 g/dL — ABNORMAL LOW (ref 12.0–15.0)
MCH: 30.3 pg (ref 26.0–34.0)
MCHC: 33.3 g/dL (ref 30.0–36.0)
MCV: 91 fL (ref 80.0–100.0)
Platelets: 370 10*3/uL (ref 150–400)
RBC: 3.23 MIL/uL — ABNORMAL LOW (ref 3.87–5.11)
RDW: 13.3 % (ref 11.5–15.5)
WBC: 12.4 10*3/uL — ABNORMAL HIGH (ref 4.0–10.5)
nRBC: 0 % (ref 0.0–0.2)

## 2020-12-04 LAB — SURGICAL PATHOLOGY

## 2020-12-04 MED ORDER — LABETALOL HCL 200 MG PO TABS
200.0000 mg | ORAL_TABLET | Freq: Two times a day (BID) | ORAL | Status: DC
  Administered 2020-12-04 – 2020-12-06 (×5): 200 mg via ORAL
  Filled 2020-12-04 (×5): qty 1

## 2020-12-04 NOTE — Lactation Note (Signed)
This note was copied from a baby's chart.  NICU Lactation Consultation Note  Patient Name: [name] [id number] Date: 12/04/2020 Age:37 hours   Subjective Reason for consult: Follow-up assessment Mother is off Mag today and feeling better. She is pumping frequently and HE to increase colostrum collection. We reviewed feeding readiness cues and lick & learn bf'ing. She is aware of LC services prn.   Objective Infant data: Mother's Current Feeding Choice: Breast Milk  Infant feeding assessment Scale for Readiness: 2   Maternal data: G1P0101  C-Section, Low Transverse No data recorded Current breast feeding challenges:: (+) breast changes  Does the patient have breastfeeding experience prior to this delivery?: No  Pumping frequency: q3 / 48mL Pumped volume: 0 mL (drops) Flange Size: 24; 21   Pump: DEBP; Employee Pump (FOB is a Producer, television/film/video, mom is under her insurance and wants to get the [employer] employee pump.)   Assessment Infant: Maternal: Milk volume: Normal Mother has risk factors for low milk supply: AMA and GDM. Pumping and HE frequently is beneficial for her to maximize milk supply.  Intervention/Plan Interventions: Education; Infant Driven Feeding Algorithm education  Tools: Pump; Flanges  Plan: Consult Status: Follow-up  NICU Follow-up type: Verify absence of engorgement; Verify onset of copious milk; Assist with IDF-1 (Mother to pre-pump before breastfeeding)    [name] 12/04/2020, 9:59 AM

## 2020-12-04 NOTE — Progress Notes (Signed)
Patient screened out for psychosocial assessment since none of the following apply: °Psychosocial stressors documented in mother or baby's chart °Gestation less than 32 weeks °Code at delivery  °Infant with anomalies °Please contact the Clinical Social Worker if specific needs arise, by MOB's request, or if MOB scores greater than 9/yes to question 10 on Edinburgh Postpartum Depression Screen. ° °Maxfield Gildersleeve Boyd-Gilyard, MSW, LCSW °Clinical Social Work °[phone] °  °

## 2020-12-04 NOTE — Lactation Note (Signed)
This note was copied from a baby's chart.  NICU Lactation Consultation Note  Patient Name: [name] [id number] Date: 12/04/2020 Age:37 hours   Subjective Reason for consult: Follow-up assessment LC to NICU room to observe baby at breast. Mom is comfortable positioning. LC observed baby latch and suckle briefly.  Objective Infant data: Mother's Current Feeding Choice: Breast Milk and Donor Milk  Infant feeding assessment Scale for Readiness: 2   . Maternal data: G1P0101  C-Section, Low Transverse No data recorded Current breast feeding challenges:: (+) breast changes  Does the patient have breastfeeding experience prior to this delivery?: No   Pumping frequency: q3 / 48mL Pumped volume: 0 mL (drops) Flange Size: 24; 21   Pump: DEBP; Employee Pump (FOB is a Producer, television/film/video, mom is under her insurance and wants to get the [employer] employee pump.)   Assessment Infant: Baby with lots of bf'ing interest. A few good, brief latches observed with non-nutritive suckling.   Maternal: Milk volume: Normal Mother is comfortable positioning and latching.  Intervention/Plan Interventions: Education; Breast feeding basics reviewed  Tools: Pump; Flanges  Plan: Consult Status: Follow-up  NICU Follow-up type: New admission follow up    [name] 12/04/2020, 12:09 PM

## 2020-12-04 NOTE — Progress Notes (Signed)
POD #2 C Section  PPROM  - Chorioamnionitis Preeclampsia - Elevated LFTs  S:  Pain well controlled. Doing well. Denies s/s PIH or fever/chills.   O:  BP 125/72 (BP Location: Right Arm)   Pulse 83   Temp 98 F (36.7 C) (Oral)   Resp 18   Ht 5\' 3"  (1.6 m)   Wt 123.8 kg   SpO2 99%   Breastfeeding Unknown   BMI 48.36 kg/m  Results for orders placed or performed during the hospital encounter of 11/28/20 (from the past 24 hour(s))  CBC     Status: Abnormal   Collection Time: 12/04/20  5:24 AM  Result Value Ref Range   WBC 12.4 (H) 4.0 - 10.5 K/uL   RBC 3.23 (L) 3.87 - 5.11 MIL/uL   Hemoglobin 9.8 (L) 12.0 - 15.0 g/dL   HCT 13/02/22 (L) 85.2 - 77.8 %   MCV 91.0 80.0 - 100.0 fL   MCH 30.3 26.0 - 34.0 pg   MCHC 33.3 30.0 - 36.0 g/dL   RDW 24.2 35.3 - 61.4 %   Platelets 370 150 - 400 K/uL   nRBC 0.0 0.0 - 0.2 %   Scheduled Meds:  acetaminophen  1,000 mg Oral Q6H   prenatal multivitamin  1 tablet Oral Q1200   scopolamine  1 patch Transdermal Once   senna-docusate  2 tablet Oral Daily   simethicone  80 mg Oral TID PC   Tdap  0.5 mL Intramuscular Once   Continuous Infusions:  ampicillin-sulbactam (UNASYN) 3 g IVPB (Mini-Bag Plus) 3 g (12/04/20 0456)   lactated ringers 50 mL/hr at 12/04/20 0456   magnesium sulfate 2 g/hr (12/04/20 0321)   naLOXone (NARCAN) adult infusion for PRURITIS     PRN Meds:.coconut oil, cyclobenzaprine, witch hazel-glycerin **AND** dibucaine, diphenhydrAMINE **OR** diphenhydrAMINE, diphenhydrAMINE, HYDROmorphone (DILAUDID) injection, ibuprofen, menthol-cetylpyridinium, nalbuphine **OR** nalbuphine, nalbuphine **OR** nalbuphine, naloxone **AND** sodium chloride flush, naLOXone (NARCAN) adult infusion for PRURITIS, ondansetron (ZOFRAN) IV, oxyCODONE, simethicone, zolpidem  Abdomen is soft and non tender Pressure dressing removed - bandage underneath is clean, dry, and intact  POD # 2 Bps and PIH labs improving - D/c Mag AF, lekocytosis almost completely  resolved - d/c 13/02/22 MD

## 2020-12-05 LAB — COMPREHENSIVE METABOLIC PANEL
ALT: 111 U/L — ABNORMAL HIGH (ref 0–44)
AST: 58 U/L — ABNORMAL HIGH (ref 15–41)
Albumin: 2.4 g/dL — ABNORMAL LOW (ref 3.5–5.0)
Alkaline Phosphatase: 83 U/L (ref 38–126)
Anion gap: 8 (ref 5–15)
BUN: 14 mg/dL (ref 6–20)
CO2: 23 mmol/L (ref 22–32)
Calcium: 8.8 mg/dL — ABNORMAL LOW (ref 8.9–10.3)
Chloride: 108 mmol/L (ref 98–111)
Creatinine, Ser: 0.62 mg/dL (ref 0.44–1.00)
GFR, Estimated: >60 mL/min (ref >60)
Glucose, Bld: 94 mg/dL (ref 70–99)
Potassium: 3.9 mmol/L (ref 3.5–5.1)
Sodium: 139 mmol/L (ref 135–145)
Total Bilirubin: 0.4 mg/dL (ref 0.3–1.2)
Total Protein: 5.5 g/dL — ABNORMAL LOW (ref 6.5–8.1)

## 2020-12-05 LAB — CBC
HCT: 30.8 % — ABNORMAL LOW (ref 36.0–46.0)
Hemoglobin: 10 g/dL — ABNORMAL LOW (ref 12.0–15.0)
MCH: 29.9 pg (ref 26.0–34.0)
MCHC: 32.5 g/dL (ref 30.0–36.0)
MCV: 92.2 fL (ref 80.0–100.0)
Platelets: 402 10*3/uL — ABNORMAL HIGH (ref 150–400)
RBC: 3.34 MIL/uL — ABNORMAL LOW (ref 3.87–5.11)
RDW: 13.4 % (ref 11.5–15.5)
WBC: 11.8 10*3/uL — ABNORMAL HIGH (ref 4.0–10.5)
nRBC: 0 % (ref 0.0–0.2)

## 2020-12-05 LAB — TYPE AND SCREEN
ABO/RH(D): A POS
Antibody Screen: NEGATIVE

## 2020-12-05 NOTE — Lactation Note (Addendum)
This note was copied from a baby's chart. Lactation Consultation Note  Patient Name: [name] [id number] Date: 12/05/2020 Reason for consult: Follow-up assessment;Primapara;1st time breastfeeding;NICU baby;Late-preterm 34-36.6wks Age:37 hours  Lactation followed up with [name]. She states that her milk volume increased overnight to 20+ mls/session (she pumped 60 mls last night). I recommended that she switch settings on her DEBP to the expression phase. We discussed taking pump kit to NICU to pump while staying with "[employer]." Ms. Tate-Deman is working on breast feeding and is interested in IT trainer later today. Lactation to follow up in the NICU.  1045 - Lactation followed up in NICU room for latch attempt. [name] shows independence in ability to position Silas at the breast. Baby did some licking today as she hand expressed transitional milk onto his mouth, but he was too sleepy to latch at this time. I provided cleaning supplies and larger storage bottles for the NICU, and RN provided labels. [name] pumped just prior to this visit and obtained 60 mls.   Maternal Data Does the patient have breastfeeding experience prior to this delivery?: No  Feeding Mother's Current Feeding Choice: Breast Milk and Donor Milk   Lactation Tools Discussed/Used Tools: Pump Flange Size: 24 Breast pump type: Double-Electric Breast Pump Pump Education: Setup, frequency, and cleaning Reason for Pumping: NICU Pumping frequency: rec q3 hours Pumped volume: 60 mL  Interventions Interventions: Breast feeding basics reviewed;DEBP;Education  Discharge Pump: DEBP  Consult Status Consult Status: Follow-up Date: 12/05/20 Follow-up type: In-patient    [name] 12/05/2020, 9:59 AM

## 2020-12-05 NOTE — Progress Notes (Signed)
Subjective: Postpartum Day 3: Cesarean Delivery Patient reports tolerating PO, + flatus, and no problems voiding.  No HA, vision change, RUQ pain, CP/SOB.  Baby doing well in NICU.  Objective: Vital signs in last 24 hours: Temp:  [98 F (36.7 C)-98.8 F (37.1 C)] 98.6 F (37 C) (11/03 0839) Pulse Rate:  [78-92] 85 (11/03 0839) Resp:  [17-20] 17 (11/03 0839) BP: (117-152)/(56-94) 134/77 (11/03 0839) SpO2:  [96 %-99 %] 96 % (11/03 0839)  Physical Exam:  General: alert, cooperative, and appears stated age 35: appropriate Uterine Fundus: firm Incision: healing well, no significant drainage, no dehiscence, honeycomb saturated with old blood DVT Evaluation: No evidence of DVT seen on physical exam. Negative Homan's sign. No cords or calf tenderness.  Recent Labs    12/04/20 0524 12/05/20 0436  HGB 9.8* 10.0*  HCT 29.4* 30.8*   CMP Latest Ref Rng & Units 12/05/2020 12/04/2020 12/03/2020  Glucose 70 - 99 mg/dL 94 161(W) 960(A)  BUN 6 - 20 mg/dL 14 8 13   Creatinine 0.44 - 1.00 mg/dL 5.40 9.81  Sodium 135 - 145 mmol/L 139 134(L) 131(L)  Potassium 3.5 - 5.1 mmol/L 3.9 3.4(L) 4.0  Chloride 98 - 111 mmol/L 108 104 103  CO2 22 - 32 mmol/L 23 23 21(L)  Calcium 8.9 - 10.3 mg/dL 1.91) 7.6(L) 8.3(L)  Total Protein 6.5 - 8.1 g/dL 4.7(W) 5.1(L) 5.8(L)  Total Bilirubin 0.3 - 1.2 mg/dL 0.4 0.6 0.5  Alkaline Phos 38 - 126 U/L 83 80 101  AST 15 - 41 U/L 58(H) 28 45(H)  ALT 0 - 44 U/L 111(H) 114(H) 129(H)    Assessment/Plan: Status post Cesarean section. Doing well postoperatively.  Continue current care. Replace honeycomb Pre-eclampsia-s/p magnesium sulfate recovery.  Started on labetalol 200 BID and pressures are in normal range.  No symptoms.  LFTs still elevated but stable.  No need to continue to monitor.  Will continue to watch BPs and likely d/c home tomorrow. Chorio-s/p abx.  Afebrile and WBC normalized.   2.9(F 12/05/2020, 9:38 AM

## 2020-12-05 NOTE — Plan of Care (Signed)
  Problem: Health Behavior/Discharge Planning: Goal: Ability to manage health-related needs will improve Outcome: Progressing   Problem: Clinical Measurements: Goal: Ability to maintain clinical measurements within normal limits will improve Outcome: Progressing Goal: Will remain free from infection Outcome: Progressing Goal: Diagnostic test results will improve Outcome: Progressing   Problem: Activity: Goal: Risk for activity intolerance will decrease Outcome: Progressing   Problem: Nutrition: Goal: Adequate nutrition will be maintained Outcome: Progressing   Problem: Coping: Goal: Level of anxiety will decrease Outcome: Progressing   Problem: Elimination: Goal: Will not experience complications related to bowel motility Outcome: Progressing Goal: Will not experience complications related to urinary retention Outcome: Progressing   Problem: Pain Managment: Goal: General experience of comfort will improve Outcome: Progressing   Problem: Safety: Goal: Ability to remain free from injury will improve Outcome: Progressing   Problem: Skin Integrity: Goal: Risk for impaired skin integrity will decrease Outcome: Progressing   Problem: Education: Goal: Knowledge of disease or condition will improve Outcome: Progressing Goal: Knowledge of the prescribed therapeutic regimen will improve Outcome: Progressing Goal: Individualized Educational Video(s) Outcome: Progressing   Problem: Clinical Measurements: Goal: Complications related to the disease process, condition or treatment will be avoided or minimized Outcome: Progressing   Problem: Education: Goal: Knowledge of condition will improve Outcome: Progressing Goal: Individualized Educational Video(s) Outcome: Progressing Goal: Individualized Newborn Educational Video(s) Outcome: Progressing   Problem: Activity: Goal: Will verbalize the importance of balancing activity with adequate rest periods Outcome:  Progressing Goal: Ability to tolerate increased activity will improve Outcome: Progressing   Problem: Coping: Goal: Ability to identify and utilize available resources and services will improve Outcome: Progressing   Problem: Life Cycle: Goal: Chance of risk for complications during the postpartum period will decrease Outcome: Progressing   Problem: Role Relationship: Goal: Ability to demonstrate positive interaction with newborn will improve Outcome: Progressing   Problem: Skin Integrity: Goal: Demonstration of wound healing without infection will improve Outcome: Progressing

## 2020-12-05 NOTE — Progress Notes (Signed)
CSW acknowledged consult and followed up with MOB's RN. RN reported that MOB currently had visitors. CSW will attempt to meet with MOB tomorrow.   Grove Defina, LCSW Clinical Social Worker [facility] Cell#: [phone] 

## 2020-12-06 ENCOUNTER — Other Ambulatory Visit (HOSPITAL_COMMUNITY): Payer: Self-pay

## 2020-12-06 MED ORDER — LABETALOL HCL 200 MG PO TABS
200.0000 mg | ORAL_TABLET | Freq: Two times a day (BID) | ORAL | 2 refills | Status: DC
  Filled 2020-12-06: qty 60, 30d supply, fill #0

## 2020-12-06 MED ORDER — CYCLOBENZAPRINE HCL 10 MG PO TABS
10.0000 mg | ORAL_TABLET | Freq: Three times a day (TID) | ORAL | 0 refills | Status: DC | PRN
  Filled 2020-12-06: qty 18, 6d supply, fill #0

## 2020-12-06 MED ORDER — OXYCODONE HCL 5 MG PO TABS
5.0000 mg | ORAL_TABLET | ORAL | 0 refills | Status: DC | PRN
  Filled 2020-12-06: qty 18, 3d supply, fill #0

## 2020-12-06 MED ORDER — ACETAMINOPHEN 325 MG PO TABS
650.0000 mg | ORAL_TABLET | Freq: Four times a day (QID) | ORAL | 0 refills | Status: AC | PRN
  Filled 2020-12-06: qty 30, 4d supply, fill #0

## 2020-12-06 MED ORDER — IBUPROFEN 600 MG PO TABS
600.0000 mg | ORAL_TABLET | Freq: Four times a day (QID) | ORAL | 0 refills | Status: DC | PRN
  Filled 2020-12-06: qty 30, 8d supply, fill #0

## 2020-12-06 NOTE — Progress Notes (Signed)
Discharge instructions and prescriptions given to pt. Discussed post c-section care, signs and symptoms to report to the MD, upcoming appointments, and meds. Pt verbalizes understanding and has no questions or concerns at this time. Pt discharged home from hospital in stable condition. 

## 2020-12-06 NOTE — Lactation Note (Signed)
This note was copied from a baby's chart.  NICU Lactation Consultation Note  Patient Name: [name] [id number] Date: 12/06/2020 Age:37 days   Subjective Reason for consult: Follow-up assessment; Maternal discharge Mother's milk is in. She notices breast fullness but denies engorgement. She is pumping frequently.   Mother will d/c today. She will continue to work on breastfeeding with baby in the NICU.   Objective Infant data: Mother's Current Feeding Choice: Breast Milk  Infant feeding assessment Scale for Readiness: 3  Maternal data: G1P0101  C-Section, Low Transverse  Does the patient have breastfeeding experience prior to this delivery?: No  Pumping frequency: in past 24 hours. Mom pumping frequently. Pumped volume: 60 mL Flange Size: 24   Pump: DEBP Medela pump Assessment Infant: LATCH Score: 6  Maternal: Milk volume: Normal   Intervention/Plan Interventions: Education  Tools: Pump  Plan: Consult Status: Follow-up  NICU Follow-up type: Verify absence of engorgement Mother to continue pumping q3. LC will plan f/u in NICU to assist further prn.    [name] 12/06/2020, 10:29 AM

## 2020-12-06 NOTE — Progress Notes (Signed)
Postpartum Progress Note  Postpartum Day 4 s/p primary Cesarean section, 34 weeks PPROM, preeclampsia.  Subjective:  Patient reports no overnight events.  She reports well controlled pain, ambulating without difficulty, voiding spontaneously, tolerating PO.  She reports Positive flatus, Positive BM.  Vaginal bleeding is minimal.  Objective: Blood pressure 136/85, pulse 88, temperature 98.1 F (36.7 C), temperature source Oral, resp. rate 18, height 5\' 3"  (1.6 m), weight 123.8 kg, SpO2 99 %, unknown if currently breastfeeding.  Physical Exam:  General: alert and no distress Lochia: appropriate Uterine Fundus: firm Incision: dressing in place DVT Evaluation: No evidence of DVT seen on physical exam.  Recent Labs    12/04/20 0524 12/05/20 0436  HGB 9.8* 10.0*  HCT 29.4* 30.8*    Assessment/Plan: Postpartum Day 4, s/p C-section Preeclampsia: BP controlled with labetalol 200 mg BID. Will continue, plan for 1 week office BP check Lactation following Doing well, continue routine postpartum care. Anticipate discharge today. Baby doing well in NICU.    LOS: 8 days   13/03/22 12/06/2020, 10:07 AM

## 2020-12-06 NOTE — Discharge Summary (Signed)
Obstetric Discharge Summary  [name] is a 35 y.o. female that presented on 11/28/2020 for leakage of fluid at [redacted]w[redacted]d. She was admitted for PPROM. Pregnancy was also notable for A1GDM.  Her stay was complicated by preeclampsia as well as development of chorioamnionitis, necessitating delivery on 12/02/20 via C section. She delivered a viable female infant.  Her postpartum course was uncomplicated and on PPD#4, she reported well controlled pain, spontaneous voiding, ambulating without difficulty, and tolerating PO. She was begun on labetalol for blood pressure control. She was stable for discharge home on 12/06/20 with plans for in-office follow up.  Hemoglobin  Date Value Ref Range Status  12/05/2020 10.0 (L) 12.0 - 15.0 g/dL Final   HCT  Date Value Ref Range Status  12/05/2020 30.8 (L) 36.0 - 46.0 % Final    Physical Exam:  General: alert Lochia: appropriate Uterine Fundus: firm Incision: healing well DVT Evaluation: No evidence of DVT seen on physical exam.  Discharge Diagnoses: PPROM, prematurity, preeclampsia, A1GDM  Discharge Information: Date: 12/06/2020 Activity: Pelvic rest, as tolerated Diet: routine Medications: Tylenol, motrin, oxycodone, flexeril Condition: stable Instructions: Refer to practice specific booklet.  Discussed prior to discharge.  Discharge to: Home  Follow-up Information     [place] Follow up.   Why: Please follow up for 1 week blood pressure check and 6 week postpartum visit. Contact information: [address] [place] [address] [phone]                 Newborn Data: Live born female  Birth Weight: 5 lb 9.6 oz (2540 g) APGAR: 8, 9  Newborn Delivery   Birth date/time: 12/02/2020 17:43:00 Delivery type: C-Section, Low Transverse Trial of labor: No C-section categorization: Primary      Home with mother.  Lyn Henri 12/06/2020, 7:14 PM

## 2020-12-08 ENCOUNTER — Ambulatory Visit: Payer: Self-pay

## 2020-12-08 NOTE — Lactation Note (Signed)
This note was copied from a baby's chart. Lactation Consultation Note  Patient Name: [name] [id number] Date: 12/08/2020 Reason for consult: Follow-up assessment;Mother's request;Primapara;1st time breastfeeding;NICU baby;Late-preterm 34-36.6wks Age:37 days  1130 - I followed up with Ms. Markie, her husband, and their son, Bruna Potter, to observe and assist with breast feeding. AT this visit, Bruna Potter was initially in quiet alert state, but when we positioned him at the breast in cross cradle hold (left side) he became sleepy. I educated on how this behavior is consistent with prematurity. Parents states that baby had a good feeding yesterday at this time; however, he was more alert at that feeding. They verbalize understanding of pre-term feeding patterns.  I provided some 8 ounce milk storage containers due to Ms. Santiago's strong milk volume. I also provided additional nursing pads.   Lactation will continue to monitor and assist.  Maternal Data Has patient been taught Hand Expression?: Yes  Feeding Mother's Current Feeding Choice: Breast Milk  LATCH Score Latch: Too sleepy or reluctant, no latch achieved, no sucking elicited.  Audible Swallowing: None  Type of Nipple: Everted at rest and after stimulation  Comfort (Breast/Nipple): Soft / non-tender  Hold (Positioning): Assistance needed to correctly position infant at breast and maintain latch.  LATCH Score: 5   Lactation Tools Discussed/Used Breast pump type: Double-Electric Breast Pump (Personal) Pump Education: Setup, frequency, and cleaning Reason for Pumping: NICU; separation and stimulation Pumping frequency: rec q3 hours Pumped volume: 240 mL (5-6 ounces/side)  Interventions Interventions: Breast feeding basics reviewed;Assisted with latch;Skin to skin;Hand express;Adjust position;Support pillows;Education  Discharge Pump: DEBP;Personal  Consult Status Consult Status: Follow-up Date: 12/08/20 Follow-up  type: In-patient    Walker Shadow 12/08/2020, 12:29 PM

## 2020-12-09 ENCOUNTER — Ambulatory Visit: Payer: Self-pay

## 2020-12-09 NOTE — Lactation Note (Signed)
This note was copied from a baby's chart. Lactation Consultation Note Lactation f/u to answer questions and provide additional pumping supplies. Mother continues to pump with good output. She denies engorgement. Baby wakes occasionally for lick and learn; mostly sleeps. We reviewed normalcy. Mother is aware of LC services and will have LC paged for additional assistance this week.   Patient Name: [name] Date: 12/09/2020   Age:37 days  [name] 12/09/2020, 4:34 PM

## 2020-12-10 ENCOUNTER — Ambulatory Visit: Payer: Self-pay

## 2020-12-10 NOTE — Lactation Note (Signed)
This note was copied from a baby's chart. Lactation Consultation Note  Patient Name: [name] [id number]'K Date: 12/10/2020 Reason for consult: Follow-up assessment;1st time breastfeeding;NICU baby;Infant < 6lbs;Primapara;Other (Comment);Late-preterm 34-36.6wks (AMA) Age:37 days  Visited with mom of 37 days old LPI NICU female, she's a P1 and already experienced the full onset of lactogenesis II, she's pumping consistently and reports no pain discomfort when pumping or at rest. LC assisted mom with hand expression prior latching, some edema noted on both lower inner quadrants, but tissue was still compressible.  Mom positioned baby in cross cradle position to the left breast first, baby was awake and alert but he would not suck at the breast at first. After some hand expression and finger feeding, he did a few sucks on and off but then started falling asleep. Mom requested to switch positions, we tried football this time with the same results, but mom reported it was more comfortable for her.  Reviewed LPI behavior, feeding cues, pumping schedule and feeding readiness. Mom is open to try bottle feeding as long as baby is kept exclusively on breastmilk, she also prefers a breastmilk fortifier if baby's milk needs to be higher in calories.  Maternal Data  Mom's milk is in and she's having an abundant supply  Feeding Mother's Current Feeding Choice: Breast Milk  LATCH Score Latch: Repeated attempts needed to sustain latch, nipple held in mouth throughout feeding, stimulation needed to elicit sucking reflex. (only a few sucks)  Audible Swallowing: None  Type of Nipple: Everted at rest and after stimulation  Comfort (Breast/Nipple): Soft / non-tender  Hold (Positioning): Assistance needed to correctly position infant at breast and maintain latch.  LATCH Score: 6  Lactation Tools Discussed/Used Tools: Pump;Flanges Flange Size: 24 Breast pump type: Double-Electric Breast Pump Pump  Education: Setup, frequency, and cleaning;Milk Storage Reason for Pumping: LPI in NICU Pumping frequency: 7 times/24 hours Pumped volume: 240 mL (sometimes up to 270 ml)  Interventions Interventions: Breast feeding basics reviewed;Assisted with latch;Skin to skin;Breast massage;Hand express;Adjust position;Support pillows;Position options;Breast compression;DEBP;Education  Plan of care:   Encouraged mom to continue pumping consistently at least 8 times/24 hours but since her supply is abundant, she can keep going at there own pace She'll continue taking baby to breast when feeding cues are present   FOB present and very supportive; he asked LC what else he could do to support mom and baby. Advised for some STS care and helping out washing pump parts. All questions and concerns answered, family to call NICU LC PRN.  Discharge Pump: DEBP;Personal  Consult Status Consult Status: Follow-up Date: 12/10/20 Follow-up type: In-patient   Shakeela Rabadan [name] 12/10/2020, 11:14 AM

## 2020-12-13 ENCOUNTER — Ambulatory Visit: Payer: Self-pay

## 2020-12-13 NOTE — Lactation Note (Signed)
This note was copied from a baby's chart.  NICU Lactation Consultation Note  Patient Name: Boy Carolynne Schuchard EXNTZ'G Date: 12/13/2020 Age:37 days  Subjective Reason for consult: Follow-up assessment Mother continues to offer un-pumped breast when baby is awake during daytime feedings. She notices that he is waking more consistently today. Mother and FOB asked questions about bottle feeding during the night. LC referred to SLP for evaluation.   Objective Infant data: Mother's Current Feeding Choice: Breast Milk  Infant feeding assessment Scale for Readiness: 2   Maternal data: G1P0101  C-Section, Low Transverse   Assessment  Intervention/Plan Interventions: Education; Infant Driven Feeding Algorithm education  Plan: Consult Status: Follow-up  NICU Follow-up type: Assist with IDF-2 (Mother does not need to pre-pump before breastfeeding); Weekly NICU follow up   [name] 12/13/2020, 4:58 PM

## 2020-12-14 ENCOUNTER — Ambulatory Visit: Payer: Self-pay

## 2020-12-14 NOTE — Lactation Note (Signed)
This note was copied from a baby's chart. Lactation Consultation Note Mother continues to pump with abundant supply. She and baby continue to practice bf'ing. Mother is using a nipple shield with good results. Baby is advancing bottle use at night when family is not present. Will plan return visit to observe baby at breast prn.   Patient Name: [name] SJGGE'Z Date: 12/14/2020 Reason for consult: Follow-up assessment Age:37 days    LATCH Score Latch: Repeated attempts needed to sustain latch, nipple held in mouth throughout feeding, stimulation needed to elicit sucking reflex.  Audible Swallowing: Spontaneous and intermittent  Type of Nipple: Everted at rest and after stimulation  Comfort (Breast/Nipple): Soft / non-tender  Hold (Positioning): Assistance needed to correctly position infant at breast and maintain latch.  LATCH Score: 8   Lactation Tools Discussed/Used Tools: Nipple Shields Nipple shield size: 24  Interventions Interventions: Education  Consult Status Consult Status: Follow-up Date: 12/14/20 Follow-up type: In-patient   [name] 12/14/2020, 4:30 PM

## 2020-12-15 ENCOUNTER — Ambulatory Visit: Payer: Self-pay

## 2020-12-15 NOTE — Lactation Note (Signed)
This note was copied from a baby's chart. Lactation Consultation Note  Patient Name: [name] ACZYS'A Date: 12/15/2020 Reason for consult: Follow-up assessment;Maternal endocrine disorder;Late-preterm 34-36.6wks;Primapara;1st time breastfeeding;NICU baby;Other (Comment) (AMA) Age:37 days  Visited with mom of 67 60/44 weeks old (adjusted) NICU female, she reports that baby is progressing on his PO feedings mainly from the bottle at night time when family is not present but she's also been taking him to breast.   Parents are OK with doing both, breast and bottle feeding as long as baby is kept on breastmilk. Baby has been taking up to 50 % of his bottle feedings but doesn't do much the days he has his baths; he gets very sleepy.  Parents were on their way out of the hospital but mom said she'd like to have another feeding assist with the NS # 24 at some point. Asked mom to call for assistance the next time she's here to take baby to breast. She voiced that baby is able to sustain the latch when using the NS # 24.  Maternal Data  Mom's supply is abundant/ ANL  Feeding Mother's Current Feeding Choice: Breast Milk  Lactation Tools Discussed/Used Tools: Pump;Nipple Dorris Carnes;Flanges Nipple shield size: 24 Flange Size: 24 Breast pump type: Double-Electric Breast Pump Pump Education: Setup, frequency, and cleaning;Milk Storage Reason for Pumping: LPI in NICU Pumping frequency: 7-8 times/24 hours Pumped volume: 150 mL (150-180 ml; up to 14 oz.)  Interventions Interventions: Breast feeding basics reviewed;DEBP;Education;Infant Driven Feeding Algorithm education  Plan of care:   Encouraged mom to continue pumping consistently at least 8 times/24 hours but since her supply is abundant, she can keep going at there own pace She'll continue taking baby to breast when feeding cues are present using NS # 24 PRN   FOB present and very supportive. All questions and concerns answered,  family to call NICU LC PRN.  Discharge Pump: DEBP;Personal  Consult Status Consult Status: Follow-up Date: 12/15/20 Follow-up type: In-patient   [name] [name] 12/15/2020, 4:23 PM

## 2020-12-17 ENCOUNTER — Telehealth (HOSPITAL_COMMUNITY): Payer: Self-pay | Admitting: *Deleted

## 2020-12-17 NOTE — Telephone Encounter (Signed)
Correction to previous note - EPDS today was 4.

## 2020-12-17 NOTE — Telephone Encounter (Signed)
Hospital Discharge Follow-Up Call:  Patient reports that she is doing well.  She saw MD at 1 week PP for incision and BP check and both were fine.  She did have an increase in vaginal bleeding and spoke to nurse at MD office.  Per nurse recommendation she has decreased her activity and bleeding seems to be normal at this time.  EPDS today was 2 and patient endorses this accurately reflects that she is doing well emotionally.  Baby is in NICU and patient reports he will likely be discharged soon.

## 2020-12-23 ENCOUNTER — Encounter: Payer: Self-pay | Admitting: Orthopaedic Surgery

## 2020-12-24 ENCOUNTER — Other Ambulatory Visit: Payer: Self-pay | Admitting: Physician Assistant

## 2020-12-24 ENCOUNTER — Other Ambulatory Visit (HOSPITAL_COMMUNITY): Payer: Self-pay

## 2020-12-24 MED ORDER — DICLOFENAC SODIUM 75 MG PO TBEC
75.0000 mg | DELAYED_RELEASE_TABLET | Freq: Two times a day (BID) | ORAL | 3 refills | Status: DC | PRN
  Filled 2020-12-24 – 2021-03-25 (×2): qty 60, 30d supply, fill #0

## 2020-12-25 ENCOUNTER — Other Ambulatory Visit (HOSPITAL_COMMUNITY): Payer: Self-pay

## 2020-12-30 ENCOUNTER — Inpatient Hospital Stay (HOSPITAL_COMMUNITY): Payer: 59

## 2020-12-30 ENCOUNTER — Inpatient Hospital Stay (HOSPITAL_COMMUNITY)
Admission: AD | Admit: 2020-12-30 | Discharge: 2020-12-31 | Disposition: A | Discharge status: Home / Self Care | Payer: 59 | Attending: Obstetrics and Gynecology | Admitting: Obstetrics and Gynecology

## 2020-12-30 ENCOUNTER — Encounter (HOSPITAL_COMMUNITY): Payer: Self-pay | Admitting: Obstetrics and Gynecology

## 2020-12-30 DIAGNOSIS — O8620 Urinary tract infection following delivery, unspecified: Secondary | ICD-10-CM

## 2020-12-30 DIAGNOSIS — N858 Other specified noninflammatory disorders of uterus: Secondary | ICD-10-CM | POA: Diagnosis not present

## 2020-12-30 DIAGNOSIS — R55 Syncope and collapse: Secondary | ICD-10-CM | POA: Insufficient documentation

## 2020-12-30 DIAGNOSIS — R110 Nausea: Secondary | ICD-10-CM | POA: Diagnosis not present

## 2020-12-30 DIAGNOSIS — R319 Hematuria, unspecified: Secondary | ICD-10-CM | POA: Diagnosis not present

## 2020-12-30 DIAGNOSIS — R109 Unspecified abdominal pain: Secondary | ICD-10-CM

## 2020-12-30 DIAGNOSIS — O99893 Other specified diseases and conditions complicating puerperium: Secondary | ICD-10-CM | POA: Diagnosis not present

## 2020-12-30 HISTORY — DX: Other intervertebral disc degeneration, lumbar region without mention of lumbar back pain or lower extremity pain: M51.369

## 2020-12-30 HISTORY — DX: Gestational diabetes mellitus in pregnancy, unspecified control: O24.419

## 2020-12-30 HISTORY — DX: Gestational (pregnancy-induced) hypertension without significant proteinuria, unspecified trimester: O13.9

## 2020-12-30 HISTORY — DX: Other intervertebral disc degeneration, lumbar region: M51.36

## 2020-12-30 LAB — URINALYSIS, ROUTINE W REFLEX MICROSCOPIC
Bilirubin Urine: NEGATIVE
Glucose, UA: NEGATIVE mg/dL
Ketones, ur: 5 mg/dL — AB
Nitrite: NEGATIVE
Protein, ur: 100 mg/dL — AB
RBC / HPF: >50 RBC/hpf — ABNORMAL HIGH (ref 0–5)
Specific Gravity, Urine: 1.01 (ref 1.005–1.030)
WBC, UA: >50 WBC/hpf — ABNORMAL HIGH (ref 0–5)
pH: 5 (ref 5.0–8.0)

## 2020-12-30 MED ORDER — OXYCODONE-ACETAMINOPHEN 5-325 MG PO TABS
2.0000 | ORAL_TABLET | Freq: Once | ORAL | Status: AC
  Administered 2020-12-30: 23:00:00 2 via ORAL

## 2020-12-30 MED ORDER — OXYCODONE-ACETAMINOPHEN 5-325 MG PO TABS
ORAL_TABLET | ORAL | Status: AC
  Filled 2020-12-30: qty 2

## 2020-12-30 NOTE — MAU Note (Signed)
Pt reported a few days ago she was having some lower abd pain and some pain with urination. Pt  did a home UTI test and it was positive. Called OB office and ,macrobid prescribed and she took it. Pt then stated on the way home from work she started having bilateral flank pain, and lower abd [ain.nausea and felt faint. Stared having chills like she was running a fever( did not take temp). Flank pain has gotten worse over the last hour.ANd has noticed some blood in her urine.  Pt also concerned about her c-section incision feels it is swollen and may have some drainage.

## 2020-12-30 NOTE — MAU Provider Note (Signed)
History     CSN: [id number]  Arrival date and time: 12/30/20 2111   Event Date/Time   First Provider Initiated Contact with Patient 12/30/20 2214      Chief Complaint  Patient presents with   Flank Pain   Ms. [name] Wirtanen is a 35 y.o. year old G61P0101 female at 4 weeks postpartum s/p primary C/D who presents to MAU reporting she started having lower abdominal pain and pain with urination a few days ago. She took an at home UTI test which was (+) nitrites and blood. She has been Rx'd Macrobid and taking that as directed. She reports a sudden onset of bilateral flank pain earlier today. She reported having chills earlier; unsure if fever - did not take temperature at home. She took Tylenol 1000 mg around 1830 this evening. She also endorses lower abdominal pain, blood in her urine, nausea, and feeling faint. She is concerned that her C/S incision is swollen and had some drainage earlier today. She wiped and felt wetness, but cannot see the incision to say for certain. Her spouse is present and contributing to the history taking.   OB History     Gravida  1   Para  1   Term      Preterm  1   AB      Living  1      SAB      IAB      Ectopic      Multiple  0   Live Births  1           Past Medical History:  Diagnosis Date   Degenerative disc disease, lumbar    Gestational diabetes    Gestational hypertension    Mitral valve prolapse     Past Surgical History:  Procedure Laterality Date   CESAREAN SECTION N/A 12/02/2020   Procedure: CESAREAN SECTION;  Surgeon: Everlene Farrier, MD;  Location: [facility] LD ORS;  Service: Obstetrics;  Laterality: N/A;   WISDOM TOOTH EXTRACTION      Family History  Problem Relation Age of Onset   Hypertension Mother    Cancer Father     Social History   Tobacco Use   Smoking status: Never   Smokeless tobacco: Never  Vaping Use   Vaping Use: Never used  Substance Use Topics   Alcohol use: Never   Drug use: Never     Allergies:  Allergies  Allergen Reactions   Shellfish-Derived Products Anaphylaxis   Betadine [Povidone Iodine] Itching and Rash   Latex Rash    Medications Prior to Admission  Medication Sig Dispense Refill Last Dose   acetaminophen (TYLENOL) 325 MG tablet Take 2 tablets (650 mg total) by mouth every 6 (six) hours as needed. 30 tablet 0 12/30/2020   b complex vitamins capsule Take 1 capsule by mouth daily.   12/30/2020   cyclobenzaprine (FLEXERIL) 10 MG tablet Take 1 tablet (10 mg total) by mouth 3 (three) times daily as needed for muscle spasms. 18 tablet 0 12/29/2020   diclofenac (VOLTAREN) 75 MG EC tablet Take 1 tablet (75 mg total) by mouth 2 (two) times daily as needed. 60 tablet 2 Past Week   ibuprofen (ADVIL) 600 MG tablet Take 1 tablet (600 mg total) by mouth every 6 (six) hours as needed for moderate pain. 30 tablet 0 Past Month   labetalol (NORMODYNE) 200 MG tablet Take 1 tablet (200 mg total) by mouth 2 (two) times daily. 60 tablet 2 12/30/2020   levocetirizine (  XYZAL) 5 MG tablet Take 5 mg by mouth every evening.   12/29/2020   magnesium oxide (MAG-OX) 400 MG tablet Take 400 mg by mouth daily.   12/29/2020   oxyCODONE (OXY IR/ROXICODONE) 5 MG immediate release tablet Take 1 tablet (5 mg total) by mouth every 4 (four) hours as needed for moderate pain. 18 tablet 0 Past Month   Prenatal Vit-Fe Fumarate-FA (MULTIVITAMIN-PRENATAL) 27-0.8 MG TABS tablet Take 1 tablet by mouth daily at 12 noon.   12/30/2020   fluticasone (FLONASE) 50 MCG/ACT nasal spray Place 1 spray into both nostrils daily. 11.1 mL 2 More than a month    Review of Systems  Constitutional:  Positive for chills.  HENT: Negative.    Eyes: Negative.   Respiratory: Negative.    Cardiovascular: Negative.   Gastrointestinal: Negative.   Endocrine: Negative.   Genitourinary:  Positive for dysuria, flank pain, hematuria and pelvic pain.  Skin:  Positive for wound (swollen around incision and some drainage  earlier).  Allergic/Immunologic: Negative.   Neurological: Negative.   Hematological: Negative.   Psychiatric/Behavioral: Negative.    Physical Exam   Blood pressure 137/72, pulse 90, temperature 98.4 F (36.9 C), resp. rate 18, unknown if currently breastfeeding.  Physical Exam Vitals and nursing note reviewed.  Constitutional:      Appearance: Normal appearance. She is obese.  Cardiovascular:     Rate and Rhythm: Normal rate.  Pulmonary:     Effort: Pulmonary effort is normal.  Abdominal:     Palpations: Abdomen is soft.     Tenderness: There is right CVA tenderness and left CVA tenderness (L>R).  Genitourinary:    Comments: Not indicated Skin:    General: Skin is warm and dry.     Findings: No erythema.     Comments: C/S incision: edges well-approximated, no drainage or any dehiscence noted  Neurological:     Mental Status: She is alert and oriented to person, place, and time.  Psychiatric:        Mood and Affect: Mood normal.        Behavior: Behavior normal.        Thought Content: Thought content normal.        Judgment: Judgment normal.    MAU Course  Procedures  MDM CCUA UCx-- Results pending  Renal U/S Rocephin 1 gm IM  Results for orders placed or performed during the hospital encounter of 12/30/20 (from the past 24 hour(s))  Urinalysis, Routine w reflex microscopic Urine, Clean Catch     Status: Abnormal   Collection Time: 12/30/20  9:37 PM  Result Value Ref Range   Color, Urine YELLOW YELLOW   APPearance CLOUDY (A) CLEAR   Specific Gravity, Urine 1.010 1.005 - 1.030   pH 5.0 5.0 - 8.0   Glucose, UA NEGATIVE NEGATIVE mg/dL   Hgb urine dipstick LARGE (A) NEGATIVE   Bilirubin Urine NEGATIVE NEGATIVE   Ketones, ur 5 (A) NEGATIVE mg/dL   Protein, ur 100 (A) NEGATIVE mg/dL   Nitrite NEGATIVE NEGATIVE   Leukocytes,Ua LARGE (A) NEGATIVE   RBC / HPF >50 (H) 0 - 5 RBC/hpf   WBC, UA >50 (H) 0 - 5 WBC/hpf   Bacteria, UA FEW (A) NONE SEEN   WBC Clumps  PRESENT    Non Squamous Epithelial 0-5 (A) NONE SEEN     US RENAL  Result Date: 12/30/2020 CLINICAL DATA:  Flank pain and hematuria. EXAM: RENAL / URINARY TRACT ULTRASOUND COMPLETE COMPARISON:  None. FINDINGS: Right Kidney: Renal  measurements: 12.8 x 5.0 x 5.6 cm = volume: 186 mL. Echogenicity within normal limits. No mass or hydronephrosis visualized. Left Kidney: Renal measurements: 12.9 x 6.1 x 5.0 cm = volume: 207 mL. Echogenicity within normal limits. No mass or hydronephrosis visualized. Bladder: Appears normal for degree of bladder distention. Other: None. IMPRESSION: Normal renal ultrasound. Electronically Signed   By: Darliss Cheney M.D.   On: 12/30/2020 23:04      *Consult with Dr. Para March @ 0022 - notified of patient's complaints, assessments, lab & U/S results, tx plan give Rocephin IV or IM dose and Rx Keflex 500 mg TID x 7-14 days - ok to d/c home  Assessment and Plan  Postpartum UTI (urinary tract infection) - Rx for Keflex 500 mg TID x 7 days - Information provided on UTI and flank pain in adult  - Discharge home - Keep scheduled PP visit with [facility] - Patient verbalized an understanding of the plan of care and agrees.   Raelyn Mora, CNM 12/30/2020, 10:18 PM

## 2020-12-31 DIAGNOSIS — R109 Unspecified abdominal pain: Secondary | ICD-10-CM

## 2020-12-31 DIAGNOSIS — R55 Syncope and collapse: Secondary | ICD-10-CM | POA: Diagnosis not present

## 2020-12-31 DIAGNOSIS — O8620 Urinary tract infection following delivery, unspecified: Secondary | ICD-10-CM | POA: Diagnosis not present

## 2020-12-31 DIAGNOSIS — R319 Hematuria, unspecified: Secondary | ICD-10-CM | POA: Diagnosis not present

## 2020-12-31 DIAGNOSIS — N858 Other specified noninflammatory disorders of uterus: Secondary | ICD-10-CM | POA: Diagnosis not present

## 2020-12-31 DIAGNOSIS — R110 Nausea: Secondary | ICD-10-CM | POA: Diagnosis not present

## 2020-12-31 DIAGNOSIS — O99893 Other specified diseases and conditions complicating puerperium: Secondary | ICD-10-CM | POA: Diagnosis not present

## 2020-12-31 MED ORDER — CEFTRIAXONE SODIUM 1 G IJ SOLR
1.0000 g | Freq: Once | INTRAMUSCULAR | Status: AC
  Administered 2020-12-31: 1 g via INTRAMUSCULAR

## 2020-12-31 MED ORDER — LIDOCAINE HCL (PF) 1 % IJ SOLN
2.0000 mL | Freq: Once | INTRAMUSCULAR | Status: AC
  Administered 2020-12-31: 2 mL

## 2020-12-31 MED ORDER — CEPHALEXIN 500 MG PO CAPS: 500.0000 mg | ORAL_CAPSULE | Freq: Three times a day (TID) | ORAL | 0 refills | Status: DC

## 2020-12-31 NOTE — Discharge Instructions (Signed)
Please stay well-hydrated and take all the medication prescribed to you tonight, even if you start to feel better.

## 2021-01-02 LAB — URINE CULTURE: Culture: 20000 — AB

## 2021-01-03 ENCOUNTER — Encounter (HOSPITAL_COMMUNITY): Payer: Self-pay

## 2021-01-03 ENCOUNTER — Inpatient Hospital Stay (HOSPITAL_COMMUNITY): Admit: 2021-01-03 | Payer: 59 | Admitting: Obstetrics and Gynecology

## 2021-01-03 SURGERY — Surgical Case
Anesthesia: Regional

## 2021-01-06 ENCOUNTER — Other Ambulatory Visit (HOSPITAL_COMMUNITY): Payer: Self-pay

## 2021-01-15 DIAGNOSIS — N390 Urinary tract infection, site not specified: Secondary | ICD-10-CM | POA: Diagnosis not present

## 2021-01-15 DIAGNOSIS — Z1389 Encounter for screening for other disorder: Secondary | ICD-10-CM | POA: Diagnosis not present

## 2021-01-17 ENCOUNTER — Ambulatory Visit: Payer: 59 | Admitting: Cardiology

## 2021-02-17 DIAGNOSIS — Z3202 Encounter for pregnancy test, result negative: Secondary | ICD-10-CM | POA: Diagnosis not present

## 2021-02-17 DIAGNOSIS — Z30430 Encounter for insertion of intrauterine contraceptive device: Secondary | ICD-10-CM | POA: Diagnosis not present

## 2021-02-17 DIAGNOSIS — N912 Amenorrhea, unspecified: Secondary | ICD-10-CM | POA: Diagnosis not present

## 2021-02-21 ENCOUNTER — Ambulatory Visit: Payer: 59 | Admitting: Cardiology

## 2021-03-25 ENCOUNTER — Other Ambulatory Visit (HOSPITAL_COMMUNITY): Payer: Self-pay

## 2021-03-25 MED ORDER — CYCLOBENZAPRINE HCL 10 MG PO TABS: ORAL_TABLET | ORAL | 2 refills | Status: DC

## 2021-03-25 MED ORDER — EPINEPHRINE 0.3 MG/0.3ML IJ SOAJ
INTRAMUSCULAR | 3 refills | Status: AC
  Filled 2021-03-25: qty 2, 30d supply, fill #0

## 2021-03-25 MED ORDER — CYCLOBENZAPRINE HCL 10 MG PO TABS
ORAL_TABLET | ORAL | 3 refills | Status: AC
  Filled 2021-03-25: qty 30, 10d supply, fill #0

## 2021-03-25 MED ORDER — LABETALOL HCL 200 MG PO TABS
ORAL_TABLET | ORAL | 1 refills | Status: DC
  Filled 2021-04-09: qty 60, 30d supply, fill #0

## 2021-03-26 ENCOUNTER — Other Ambulatory Visit (HOSPITAL_COMMUNITY): Payer: Self-pay

## 2021-04-09 ENCOUNTER — Other Ambulatory Visit (HOSPITAL_COMMUNITY): Payer: Self-pay

## 2021-04-09 DIAGNOSIS — Z30431 Encounter for routine checking of intrauterine contraceptive device: Secondary | ICD-10-CM | POA: Diagnosis not present

## 2021-04-28 ENCOUNTER — Other Ambulatory Visit (HOSPITAL_COMMUNITY): Payer: Self-pay

## 2021-04-28 ENCOUNTER — Other Ambulatory Visit: Payer: Self-pay | Admitting: Physician Assistant

## 2021-04-28 MED ORDER — LABETALOL HCL 200 MG PO TABS
200.0000 mg | ORAL_TABLET | Freq: Two times a day (BID) | ORAL | 0 refills | Status: DC
  Filled 2021-04-28: qty 60, 30d supply, fill #0

## 2021-05-03 ENCOUNTER — Other Ambulatory Visit (HOSPITAL_COMMUNITY): Payer: Self-pay

## 2021-05-08 ENCOUNTER — Other Ambulatory Visit (HOSPITAL_COMMUNITY): Payer: Self-pay

## 2021-05-08 ENCOUNTER — Other Ambulatory Visit: Payer: Self-pay | Admitting: Physician Assistant

## 2021-05-08 ENCOUNTER — Telehealth: Payer: Self-pay | Admitting: Physician Assistant

## 2021-05-08 NOTE — Telephone Encounter (Signed)
Pt called requesting a refill of diclofenac. Pt states [facility] pharmacy sent ina request for refill. Please send to pharmacy on file. Pt phone number is [phone]. ?

## 2021-05-09 ENCOUNTER — Other Ambulatory Visit (HOSPITAL_COMMUNITY): Payer: Self-pay

## 2021-05-12 ENCOUNTER — Other Ambulatory Visit (HOSPITAL_COMMUNITY): Payer: Self-pay

## 2021-05-12 ENCOUNTER — Other Ambulatory Visit: Payer: Self-pay | Admitting: Physician Assistant

## 2021-05-12 MED ORDER — DICLOFENAC SODIUM 75 MG PO TBEC
75.0000 mg | DELAYED_RELEASE_TABLET | Freq: Two times a day (BID) | ORAL | 3 refills | Status: DC | PRN
  Filled 2021-05-12: qty 60, 30d supply, fill #0
  Filled 2021-06-09: qty 60, 30d supply, fill #1
  Filled 2021-07-11: qty 60, 30d supply, fill #2
  Filled 2021-08-06: qty 60, 30d supply, fill #3

## 2021-05-12 NOTE — Telephone Encounter (Signed)
Called patient no answer LMOM. Rx sent to pharm.  

## 2021-05-12 NOTE — Telephone Encounter (Signed)
Tell her i'm sorry that we did not get the request but that I just sent in

## 2021-06-09 ENCOUNTER — Other Ambulatory Visit (HOSPITAL_COMMUNITY): Payer: Self-pay

## 2021-06-09 MED ORDER — LABETALOL HCL 200 MG PO TABS
200.0000 mg | ORAL_TABLET | Freq: Two times a day (BID) | ORAL | 6 refills | Status: DC
  Filled 2021-06-09: qty 60, 30d supply, fill #0
  Filled 2021-07-11: qty 60, 30d supply, fill #1
  Filled 2021-08-06: qty 60, 30d supply, fill #2

## 2021-06-10 ENCOUNTER — Other Ambulatory Visit (HOSPITAL_COMMUNITY): Payer: Self-pay

## 2021-07-11 ENCOUNTER — Other Ambulatory Visit (HOSPITAL_COMMUNITY): Payer: Self-pay

## 2021-08-06 ENCOUNTER — Other Ambulatory Visit (HOSPITAL_COMMUNITY): Payer: Self-pay

## 2021-08-11 DIAGNOSIS — S92512A Displaced fracture of proximal phalanx of left lesser toe(s), initial encounter for closed fracture: Secondary | ICD-10-CM | POA: Diagnosis not present

## 2021-08-11 DIAGNOSIS — M79675 Pain in left toe(s): Secondary | ICD-10-CM | POA: Diagnosis not present

## 2022-01-15 ENCOUNTER — Other Ambulatory Visit (HOSPITAL_COMMUNITY): Payer: Self-pay

## 2022-01-15 MED ORDER — DICLOFENAC SODIUM 75 MG PO TBEC
75.0000 mg | DELAYED_RELEASE_TABLET | Freq: Two times a day (BID) | ORAL | 0 refills | Status: DC | PRN
  Filled 2022-01-15: qty 60, 30d supply, fill #0

## 2022-11-24 ENCOUNTER — Other Ambulatory Visit (HOSPITAL_BASED_OUTPATIENT_CLINIC_OR_DEPARTMENT_OTHER): Payer: No Typology Code available for payment source

## 2022-11-24 ENCOUNTER — Ambulatory Visit (HOSPITAL_BASED_OUTPATIENT_CLINIC_OR_DEPARTMENT_OTHER): Payer: No Typology Code available for payment source | Admitting: Cardiology

## 2022-11-24 ENCOUNTER — Encounter (HOSPITAL_BASED_OUTPATIENT_CLINIC_OR_DEPARTMENT_OTHER): Payer: Self-pay | Admitting: Cardiology

## 2022-11-24 VITALS — BP 128/76 | HR 58 | Ht 63.0 in | Wt 251.0 lb

## 2022-11-24 DIAGNOSIS — Z6841 Body Mass Index (BMI) 40.0 and over, adult: Secondary | ICD-10-CM

## 2022-11-24 DIAGNOSIS — R001 Bradycardia, unspecified: Secondary | ICD-10-CM

## 2022-11-24 DIAGNOSIS — R002 Palpitations: Secondary | ICD-10-CM | POA: Diagnosis not present

## 2022-11-24 DIAGNOSIS — I80202 Phlebitis and thrombophlebitis of unspecified deep vessels of left lower extremity: Secondary | ICD-10-CM | POA: Diagnosis not present

## 2022-11-24 DIAGNOSIS — Z86711 Personal history of pulmonary embolism: Secondary | ICD-10-CM

## 2022-11-24 DIAGNOSIS — E66813 Obesity, class 3: Secondary | ICD-10-CM | POA: Diagnosis not present

## 2022-11-24 DIAGNOSIS — Z7189 Other specified counseling: Secondary | ICD-10-CM

## 2022-11-24 DIAGNOSIS — R55 Syncope and collapse: Secondary | ICD-10-CM

## 2022-11-24 NOTE — Progress Notes (Signed)
Cardiology Office Note:  .    Date:  11/24/2022  ID:  [name], DOB 1985-08-25, MRN [id number] PCP: Aviva Kluver   HeartCare Providers Cardiologist:  Jodelle Red, MD     History of Present Illness: Marland Kitchen    [name] is a 37 y.o. female with a hx of DVT, PE, hypertension, obesity, here for evaluation of bradycardia and DVT/PE. Referred by Tresa Res, FNP from [facility] for sinus bradycardia and clotting on 11/11/22. LLE duplex from 11/10/22 showed occlusive DVT in left peroneal veins. ECG shows NSR at 68 bpm. Per notes, found to have PE in RLL on 11/08/22 at [facility]. Also noted to have sinus bradycardia 50-56 bpm during her hospital admission at [facility]. Referred to cardiology for further evaluation.   Noted to be current smoker, with history of hypertension and obesity. Lipids 04/07/22 with Tchol 177, TG 112, HDL 56, LDL 101.  Today, she is accompanied by her mother. We reviewed her recent hospitalization in detail. She confirms that she had passed out in the entryway and woke up with slurred speech, therefore evaluated for stroke. She notes having a history of intermittent syncope in the past. A few days before her DVT was found, she was at work in the setting of the air conditioning malfunctioning. She became too hot and dehydrated; she lost consciousness twice that day. A few weeks prior to her DVT, she had multiple episodes of dizziness/near syncope but was able to catch herself before falling. Since returning home from the hospital, she has had minor brief dizzy spells but no syncope.  She reports that one of her recent EKG's showed a slight ST elevation, which concerned her. Also she states that her recent bradycardia was a new issue; prior to that her heart rate was always in the 90s-100s. In the past few weeks leading up to her DVT she had also noticed a few "runs" of arrhythmias, which was a new symptom.  Cardiovascular risk factors: Prior  clinical ASCVD: None prior to her recent DVT/PE. Comorbid conditions:  Hypertension - was previously on labetalol, now switched to losartan which seems to be managing her blood pressure well. In the office her blood pressure is 128/76. Panic disorder - has been well controlled lately. ADHD - Discontinued her adderall as she was concerned about the cardiovascular risk. Had recently restarted this a few months ago when she completed breastfeeding. Metabolic syndrome/Obesity:  Current weight 251 lbs. She has been planning with her PCP to start GLP1, has waited to discussed this today. Chronic inflammatory conditions: None. Tobacco use history: Former smoker, she quit subsequent to her DVT. Previously smoked 4-8 cigarettes a day. No alcohol consumption since her hospitalization. Family history: Her mother and all of her brothers have hypertension. Her father had metastatic colon cancer. Her maternal uncle had a heart attack in the setting of opioid abuse. No known history of heart disease or clotting disorders. Prior pertinent testing and/or incidental findings: In the remote past she was diagnosed with MVP. She confirms having two echocardiograms this past year that showed no evidence of MVP. 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 Has been feeling short of breath with activity. Horseback rider, has been wary of doing so on Eliquis. Current diet: Enjoys baking and cooking.  She denies any chest pain, shortness of breath, peripheral edema, headaches, orthopnea, or PND.  ROS:  Please see the history of present illness. ROS otherwise negative except as noted.  (+)  Dizzy spells (+) Syncope (+) Palpitations  Studies Reviewed: Marland Kitchen    EKG Interpretation Date/Time:  Tuesday November 24 2022 09:50:48 EDT Ventricular Rate:  58 PR Interval:  168 QRS Duration:  76 QT Interval:  376 QTC Calculation: 369 R Axis:   40  Text Interpretation: Sinus bradycardia Low voltage QRS  Poor R wave progression No previous ECGs available Confirmed by Jodelle Red [phone]) on 11/24/2022 9:53:45 AM    Left LE Venous Doppler  11/06/2022  [facility] Care): Occlusive deep venous thrombosis seen in left peroneal veins. These  results will be called to the ordering clinician or representative  by the Radiologist Assistant, and communication documented in the  PACS or zVision Dashboard.   Physical Exam:    VS:  BP 128/76   Pulse (!) 58   Ht 5\' 3"  (1.6 m)   Wt 251 lb (113.9 kg)   SpO2 98%   BMI 44.46 kg/m    Wt Readings from Last 3 Encounters:  11/24/22 251 lb (113.9 kg)  11/28/20 273 lb (123.8 kg)    GEN: Well nourished, well developed in no acute distress HEENT: Normal, moist mucous membranes NECK: No JVD CARDIAC: regular rhythm, normal S1 and S2, no rubs or gallops. No murmur. VASCULAR: Radial and DP pulses 2+ bilaterally. No carotid bruits RESPIRATORY:  Clear to auscultation without rales, wheezing or rhonchi  ABDOMEN: Soft, non-tender, non-distended MUSCULOSKELETAL:  Ambulates independently SKIN: Warm and dry, no edema NEUROLOGIC:  Alert and oriented x 3. No focal neuro deficits noted. PSYCHIATRIC:  Normal affect   ASSESSMENT AND PLAN: .    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 Risk includes tobacco use (recently quit), but no trauma, travel, or oral contraceptives -has pending consultation with hematology  Obesity, BMI 44 -I agree with GLP1RA, which she is discussing with her PCP  CV risk counseling and prevention -recommend heart healthy/Mediterranean diet, with whole grains, fruits, vegetable, fish, lean meats, nuts, and olive oil. Limit salt. -recommend moderate walking, 3-5 times/week for 30-50 minutes each session. Aim for at least 150 minutes.week. Goal should be pace of 3 miles/hours,  or walking 1.5 miles in 30 minutes -recommend avoidance of tobacco products. Avoid excess alcohol. -ASCVD risk score: The ASCVD Risk score (Arnett DK, et al., 2019) failed to calculate for the following reasons:   The 2019 ASCVD risk score is only valid for ages 25 to 30    Dispo: Follow-up TBD based on results of testing, or sooner as needed.  I,Mathew Stumpf,acting as a Neurosurgeon for [employer], MD.,have documented all relevant documentation on the behalf of Jodelle Red, MD,as directed by  Jodelle Red, MD while in the presence of Jodelle Red, MD.  I, Jodelle Red, MD, have reviewed all documentation for this visit. The documentation on 11/24/22 for the exam, diagnosis, procedures, and orders are all accurate and complete.   Signed, Jodelle Red, MD

## 2022-11-24 NOTE — Patient Instructions (Signed)
Medication Instructions:  NO CHANGES   *If you need a refill on your cardiac medications before your next appointment, please call your pharmacy*   Lab Work: NONE    If you have labs (blood work) drawn today and your tests are completely normal, you will receive your results only by: MyChart Message (if you have MyChart) OR A paper copy in the mail If you have any lab test that is abnormal or we need to change your treatment, we will call you to review the results.   Testing/Procedures: [name]- Long Term Monitor Instructions  Your physician has requested you wear a ZIO patch monitor for 14 days.  This is a single patch monitor. [employer] supplies one patch monitor per enrollment. Additional stickers are not available. Please do not apply patch if you will be having a Nuclear Stress Test,  Echocardiogram, Cardiac CT, MRI, or Chest Xray during the period you would be wearing the  monitor. The patch cannot be worn during these tests. You cannot remove and re-apply the  ZIO XT patch monitor.  Your ZIO patch monitor will be mailed 3 day USPS to your address on file. It may take 3-5 days  to receive your monitor after you have been enrolled.  Once you have received your monitor, please review the enclosed instructions. Your monitor  has already been registered assigning a specific monitor serial # to you.  Billing and Patient Assistance Program Information  We have supplied [employer] with any of your insurance information on file for billing purposes. [employer] offers a sliding scale Patient Assistance Program for patients that do not have  insurance, or whose insurance does not completely cover the cost of the ZIO monitor.  You must apply for the Patient Assistance Program to qualify for this discounted rate.  To apply, please call [employer] at [phone], select option 4, select option 2, ask to apply for  Patient Assistance Program. Meredeth Ide will ask your household income, and how many  people  are in your household. They will quote your out-of-pocket cost based on that information.  [employer] will also be able to set up a 69-month, interest-free payment plan if needed.  Applying the monitor   Shave hair from upper left chest.  Hold abrader disc by orange tab. Rub abrader in 40 strokes over the upper left chest as  indicated in your monitor instructions.  Clean area with 4 enclosed alcohol pads. Let dry.  Apply patch as indicated in monitor instructions. Patch will be placed under collarbone on left  side of chest with arrow pointing upward.  Rub patch adhesive wings for 2 minutes. Remove white label marked "1". Remove the white  label marked "2". Rub patch adhesive wings for 2 additional minutes.  While looking in a mirror, press and release button in center of patch. A small green light will  flash 3-4 times. This will be your only indicator that the monitor has been turned on.  Do not shower for the first 24 hours. You may shower after the first 24 hours.  Press the button if you feel a symptom. You will hear a small click. Record Date, Time and  Symptom in the Patient Logbook.  When you are ready to remove the patch, follow instructions on the last 2 pages of Patient  Logbook. Stick patch monitor onto the last page of Patient Logbook.  Place Patient Logbook in the blue and white box. Use locking tab on box and tape box closed  securely. The  blue and white box has prepaid postage on it. Please place it in the mailbox as  soon as possible. Your physician should have your test results approximately 7 days after the  monitor has been mailed back to [facility].  Call [facility] Customer Care at [phone] if you have questions regarding  your ZIO XT patch monitor. Call them immediately if you see an orange light blinking on your  monitor.  If your monitor falls off in less than 4 days, contact our Monitor department at [phone].  If your monitor becomes  loose or falls off after 4 days call [employer] at [phone] for  suggestions on securing your monitor    Follow-Up: At [facility], you and your health needs are our priority.  As part of our continuing mission to provide you with exceptional heart care, we have created designated Provider Care Teams.  These Care Teams include your primary Cardiologist (physician) and Advanced Practice Providers (APPs -  Physician Assistants and Nurse Practitioners) who all work together to provide you with the care you need, when you need it.  We recommend signing up for the patient portal called "MyChart".  Sign up information is provided on this After Visit Summary.  MyChart is used to connect with patients for Virtual Visits (Telemedicine).  Patients are able to view lab/test results, encounter notes, upcoming appointments, etc.  Non-urgent messages can be sent to your provider as well.   To learn more about what you can do with MyChart, go to [email or web address].    Your next appointment:   Follow up as needed    Provider:   Jodelle Red, MD
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
## 2023-01-17 IMAGING — US US RENAL
1 series · 15 of 25 positions shown · non-contrast
Comparison: None.

CLINICAL DATA: Flank pain and hematuria.

EXAM:
RENAL / URINARY TRACT ULTRASOUND COMPLETE

[Series 1: us renal · 15 of 31 slices shown]
[im 1/31]
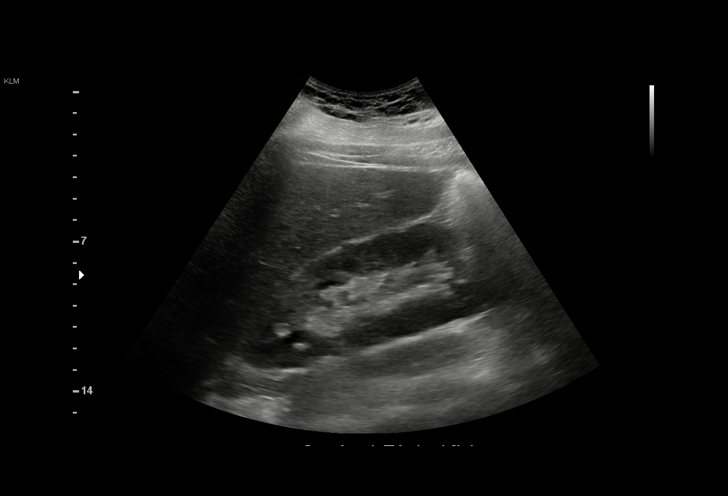
[im 3/31]
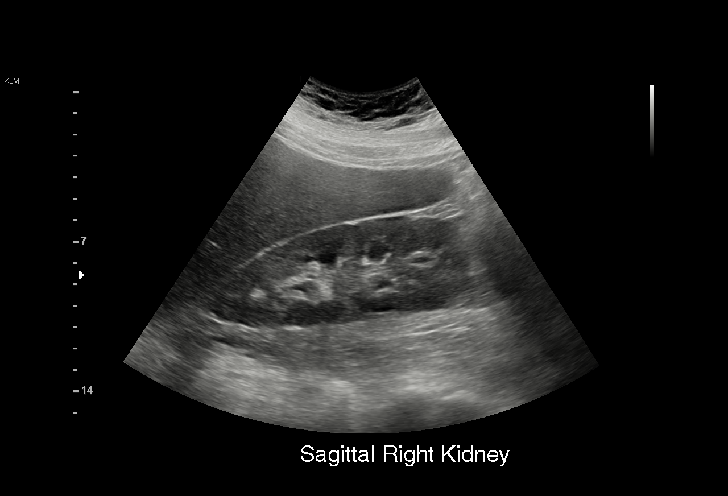
[im 6/31]
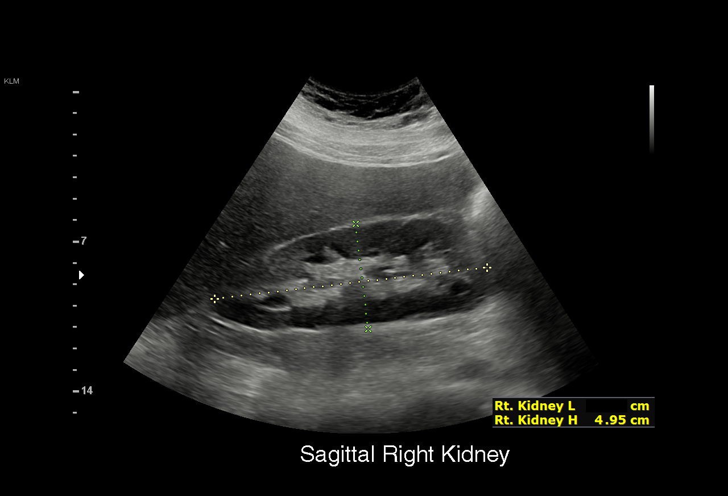
[im 7/31]
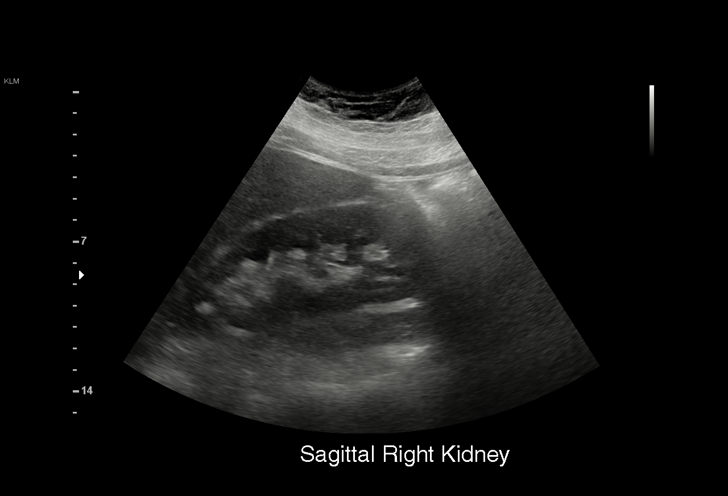
[im 9/31]
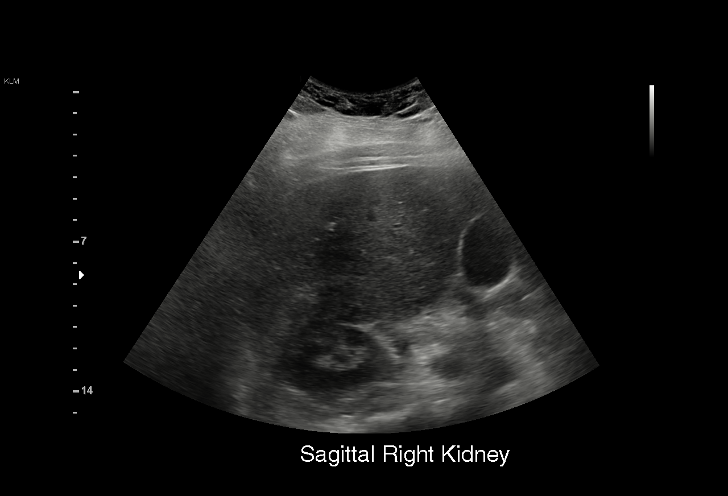
[im 12/31]
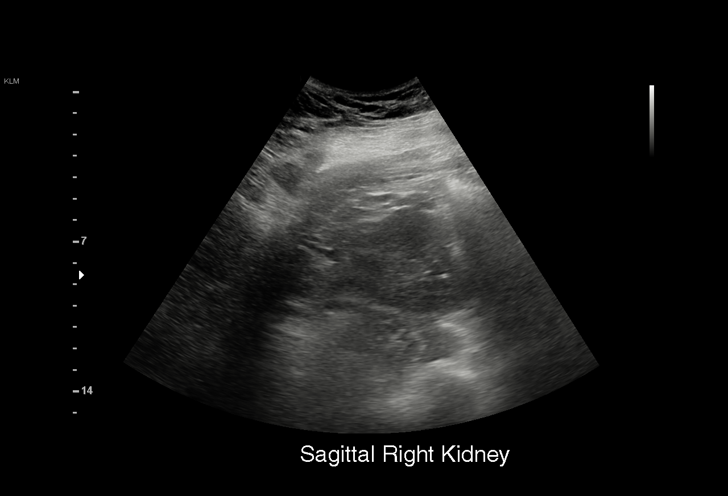
[im 13/31]
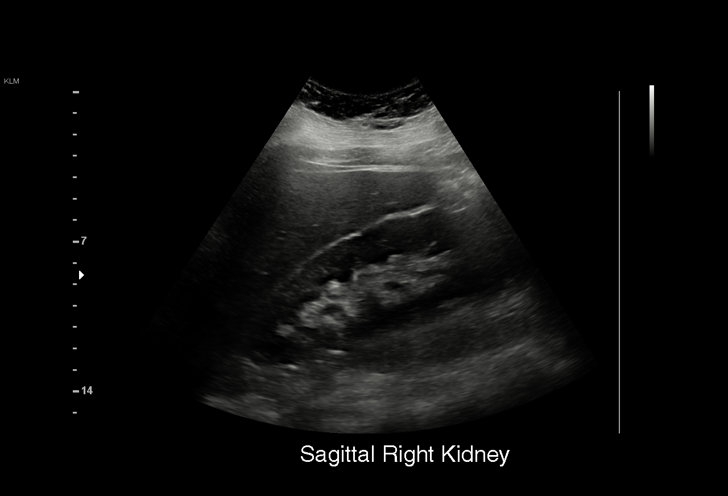
[im 16/31]
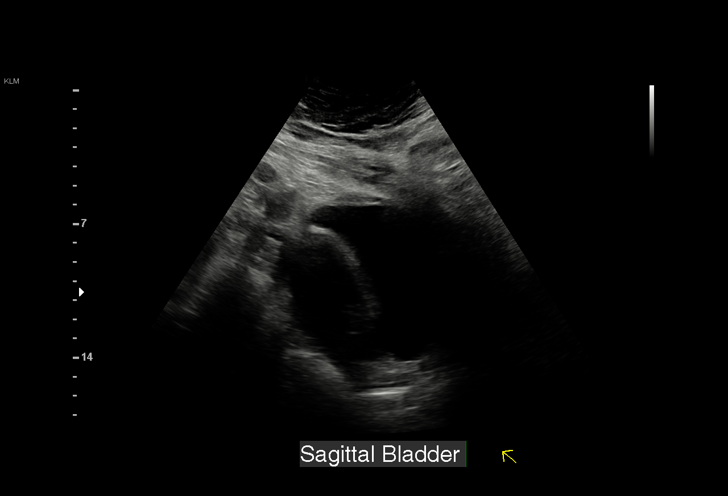
[im 18/31]
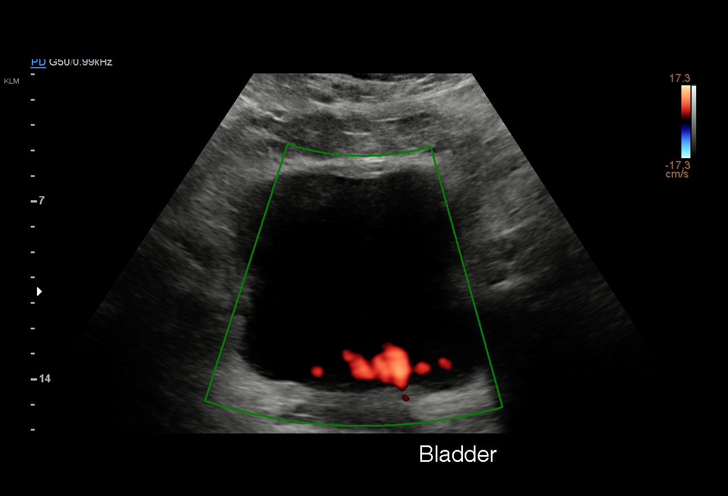
[im 19/31]
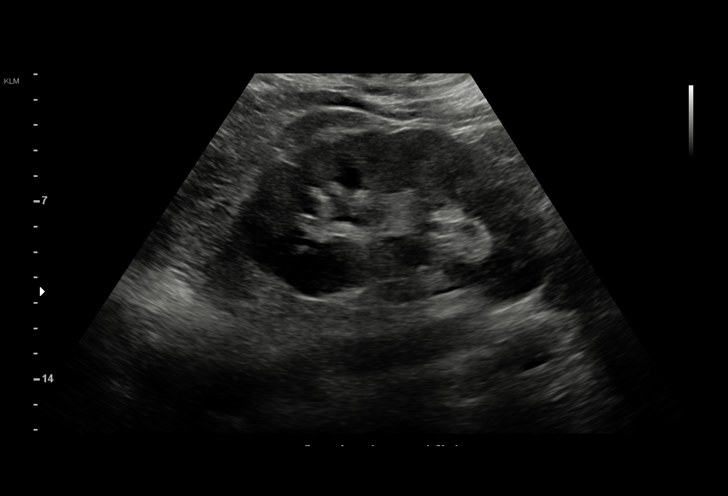
[im 22/31]
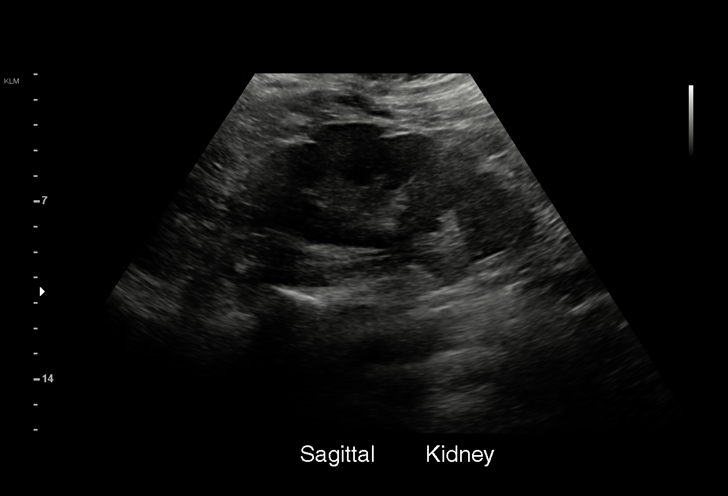
[im 24/31]
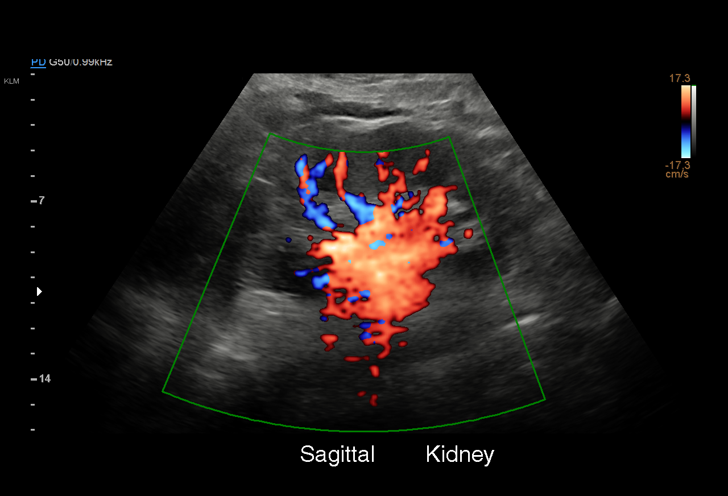
[im 26/31]
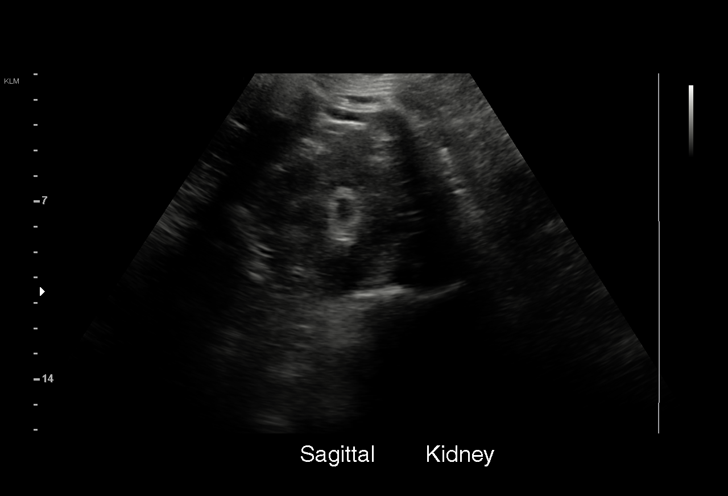
[im 28/31]
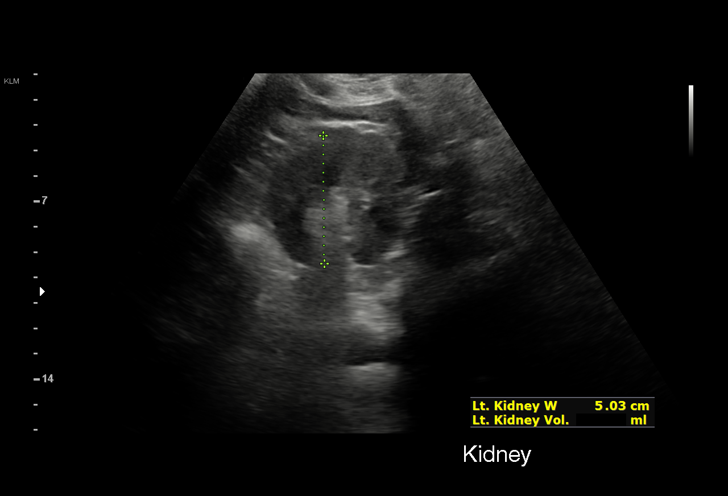
[im 31/31]
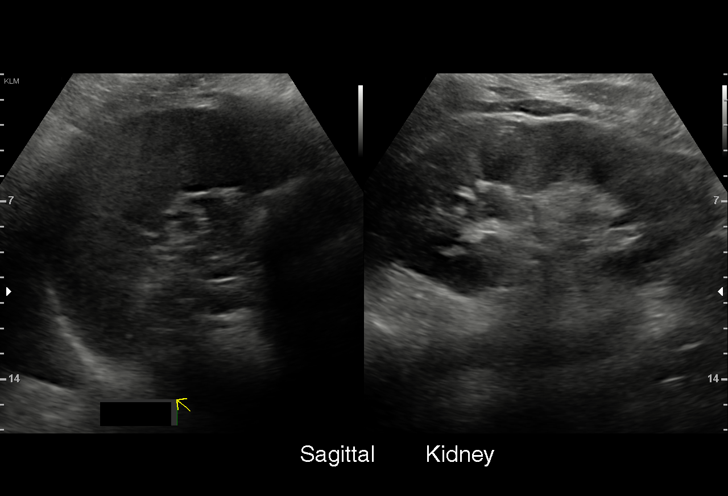

[15 of 25 positions shown; findings below may reference images not displayed]

FINDINGS: Right Kidney:

Renal measurements: 12.8 x 5.0 x 5.6 cm = volume: 186 mL.
Echogenicity within normal limits. No mass or hydronephrosis
visualized.

Left Kidney:

Renal measurements: 12.9 x 6.1 x 5.0 cm = volume: 207 mL.
Echogenicity within normal limits. No mass or hydronephrosis
visualized.

Bladder:

Appears normal for degree of bladder distention.

Other:

None.
IMPRESSION: Normal renal ultrasound.

## 2023-01-31 ENCOUNTER — Emergency Department (HOSPITAL_COMMUNITY): Payer: No Typology Code available for payment source

## 2023-01-31 ENCOUNTER — Other Ambulatory Visit: Payer: Self-pay

## 2023-01-31 ENCOUNTER — Encounter (HOSPITAL_COMMUNITY): Payer: Self-pay | Admitting: Emergency Medicine

## 2023-01-31 ENCOUNTER — Emergency Department (HOSPITAL_COMMUNITY)
Admission: EM | Admit: 2023-01-31 | Discharge: 2023-02-01 | Discharge status: Against Medical Advice | Payer: No Typology Code available for payment source | Attending: Emergency Medicine | Admitting: Emergency Medicine

## 2023-01-31 ENCOUNTER — Emergency Department (HOSPITAL_BASED_OUTPATIENT_CLINIC_OR_DEPARTMENT_OTHER): Payer: No Typology Code available for payment source

## 2023-01-31 DIAGNOSIS — R0789 Other chest pain: Secondary | ICD-10-CM | POA: Insufficient documentation

## 2023-01-31 DIAGNOSIS — M79605 Pain in left leg: Secondary | ICD-10-CM | POA: Insufficient documentation

## 2023-01-31 DIAGNOSIS — Z5321 Procedure and treatment not carried out due to patient leaving prior to being seen by health care provider: Secondary | ICD-10-CM | POA: Insufficient documentation

## 2023-01-31 DIAGNOSIS — R0602 Shortness of breath: Secondary | ICD-10-CM | POA: Insufficient documentation

## 2023-01-31 DIAGNOSIS — M79661 Pain in right lower leg: Secondary | ICD-10-CM

## 2023-01-31 DIAGNOSIS — M79604 Pain in right leg: Secondary | ICD-10-CM | POA: Insufficient documentation

## 2023-01-31 DIAGNOSIS — R059 Cough, unspecified: Secondary | ICD-10-CM | POA: Insufficient documentation

## 2023-01-31 LAB — CBC WITH DIFFERENTIAL/PLATELET
Abs Immature Granulocytes: 0.02 10*3/uL (ref 0.00–0.07)
Basophils Absolute: 0 10*3/uL (ref 0.0–0.1)
Basophils Relative: 1 %
Eosinophils Absolute: 0 10*3/uL (ref 0.0–0.5)
Eosinophils Relative: 0 %
HCT: 41.5 % (ref 36.0–46.0)
Hemoglobin: 13.8 g/dL (ref 12.0–15.0)
Immature Granulocytes: 0 %
Lymphocytes Relative: 28 %
Lymphs Abs: 2.4 10*3/uL (ref 0.7–4.0)
MCH: 30.3 pg (ref 26.0–34.0)
MCHC: 33.3 g/dL (ref 30.0–36.0)
MCV: 91.2 fL (ref 80.0–100.0)
Monocytes Absolute: 0.6 10*3/uL (ref 0.1–1.0)
Monocytes Relative: 7 %
Neutro Abs: 5.5 10*3/uL (ref 1.7–7.7)
Neutrophils Relative %: 64 %
Platelets: 380 10*3/uL (ref 150–400)
RBC: 4.55 MIL/uL (ref 3.87–5.11)
RDW: 12.7 % (ref 11.5–15.5)
WBC: 8.6 10*3/uL (ref 4.0–10.5)
nRBC: 0 % (ref 0.0–0.2)

## 2023-01-31 LAB — COMPREHENSIVE METABOLIC PANEL
ALT: 36 U/L (ref 0–44)
AST: 24 U/L (ref 15–41)
Albumin: 3.7 g/dL (ref 3.5–5.0)
Alkaline Phosphatase: 59 U/L (ref 38–126)
Anion gap: 10 (ref 5–15)
BUN: 10 mg/dL (ref 6–20)
CO2: 25 mmol/L (ref 22–32)
Calcium: 9.3 mg/dL (ref 8.9–10.3)
Chloride: 101 mmol/L (ref 98–111)
Creatinine, Ser: 0.98 mg/dL (ref 0.44–1.00)
GFR, Estimated: >60 mL/min (ref >60)
Glucose, Bld: 107 mg/dL — ABNORMAL HIGH (ref 70–99)
Potassium: 3.8 mmol/L (ref 3.5–5.1)
Sodium: 136 mmol/L (ref 135–145)
Total Bilirubin: 0.5 mg/dL (ref <1.2)
Total Protein: 7.7 g/dL (ref 6.5–8.1)

## 2023-01-31 LAB — HCG, QUANTITATIVE, PREGNANCY: hCG, Beta Chain, Quant, S: <1 m[IU]/mL (ref <5)

## 2023-01-31 LAB — TROPONIN I (HIGH SENSITIVITY)
Troponin I (High Sensitivity): <2 ng/L (ref <18)
Troponin I (High Sensitivity): 2 ng/L (ref <18)

## 2023-01-31 MED ORDER — IOHEXOL 350 MG/ML SOLN
75.0000 mL | Freq: Once | INTRAVENOUS | Status: AC | PRN
  Administered 2023-01-31: 75 mL via INTRAVENOUS

## 2023-01-31 NOTE — Progress Notes (Signed)
VASCULAR LAB    Bilateral lower extremity venous duplex has been performed.  See CV proc for preliminary results.   Logen Heintzelman, RVT 01/31/2023, 5:27 PM

## 2023-01-31 NOTE — ED Provider Triage Note (Cosign Needed)
Emergency Medicine Provider Triage Evaluation Note  [name] , a 37 y.o. female  was evaluated in triage.  Pt complains of SOB that started this week after laying around. Has cough and feels like no relief w/cough and wheezing. Reports rib cage pain and tightness. Has been compliant with eliquis. States bilateral calf pain that has worsened this week. "Feels exactly like last time I had with DVT"  Review of Systems  Positive: Leg pain, SOB Negative: fevers  Physical Exam  BP 125/89 (BP Location: Right Arm)   Pulse (!) 106   Temp 98.9 F (37.2 C) (Oral)   Resp (!) 22   SpO2 96%  Gen:   Awake, no distress   Resp:  Normal effort  MSK:   Moves extremities without difficulty    Medical Decision Making  Medically screening exam initiated at 4:01 PM.  Appropriate orders placed.  [name] was informed that the remainder of the evaluation will be completed by another provider, this initial triage assessment does not replace that evaluation, and the importance of remaining in the ED until their evaluation is complete.    Pete Pelt, [place] 01/31/23 1604

## 2023-01-31 NOTE — ED Triage Notes (Signed)
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  Has been on maintenance dose of eliquis until yesterday when advised by her physician to increase to 10 mg bid as she suspected she is having another clot.  Pt has bilateral calf pain, worse on the right.  Cough and shob and has had URI this week.  Pt does have tightness in her chest.

## 2023-02-01 NOTE — ED Notes (Signed)
Patient left.

## 2023-02-22 ENCOUNTER — Encounter (HOSPITAL_BASED_OUTPATIENT_CLINIC_OR_DEPARTMENT_OTHER): Payer: Self-pay

## 2023-02-23 ENCOUNTER — Telehealth (HOSPITAL_BASED_OUTPATIENT_CLINIC_OR_DEPARTMENT_OTHER): Payer: Self-pay

## 2023-02-23 NOTE — Telephone Encounter (Signed)
Called and left voice message with heart monitor results. (Ok per [facility])  ----- Message from Alver Sorrow sent at 02/22/2023  8:05 PM EST ----- Monitor with predominantly normal sinus rhythm. No high risk arrhythmias. Good result! May follow up with cardiology on an as-needed basis.

## 2023-02-23 NOTE — Telephone Encounter (Signed)
-----   Message from Alver Sorrow sent at 02/22/2023  8:05 PM EST ----- Monitor with predominantly normal sinus rhythm. No high risk arrhythmias. Good result! May follow up with cardiology on an as-needed basis.

## 2023-03-18 LAB — OB RESULTS CONSOLE HIV ANTIBODY (ROUTINE TESTING): HIV: NONREACTIVE

## 2023-03-18 LAB — OB RESULTS CONSOLE RUBELLA ANTIBODY, IGM: Rubella: IMMUNE

## 2023-03-18 LAB — OB RESULTS CONSOLE HEPATITIS B SURFACE ANTIGEN: Hepatitis B Surface Ag: NEGATIVE

## 2023-03-18 LAB — OB RESULTS CONSOLE RPR: RPR: NONREACTIVE

## 2023-03-18 LAB — OB RESULTS CONSOLE ANTIBODY SCREEN: Antibody Screen: NEGATIVE

## 2023-03-18 LAB — HEPATITIS C ANTIBODY: HCV Ab: NEGATIVE

## 2023-04-01 LAB — OB RESULTS CONSOLE GC/CHLAMYDIA
Chlamydia: NEGATIVE
Neisseria Gonorrhea: NEGATIVE

## 2023-04-12 ENCOUNTER — Inpatient Hospital Stay (HOSPITAL_COMMUNITY)

## 2023-04-12 ENCOUNTER — Encounter (HOSPITAL_COMMUNITY): Payer: Self-pay

## 2023-04-12 ENCOUNTER — Inpatient Hospital Stay (HOSPITAL_COMMUNITY)
Admission: AD | Admit: 2023-04-12 | Discharge: 2023-04-12 | Disposition: A | Discharge status: Home / Self Care | Attending: Obstetrics and Gynecology | Admitting: Obstetrics and Gynecology

## 2023-04-12 DIAGNOSIS — R059 Cough, unspecified: Secondary | ICD-10-CM | POA: Insufficient documentation

## 2023-04-12 DIAGNOSIS — Z3A12 12 weeks gestation of pregnancy: Secondary | ICD-10-CM | POA: Diagnosis not present

## 2023-04-12 DIAGNOSIS — J069 Acute upper respiratory infection, unspecified: Secondary | ICD-10-CM | POA: Diagnosis not present

## 2023-04-12 DIAGNOSIS — O26891 Other specified pregnancy related conditions, first trimester: Secondary | ICD-10-CM

## 2023-04-12 DIAGNOSIS — R0989 Other specified symptoms and signs involving the circulatory and respiratory systems: Secondary | ICD-10-CM

## 2023-04-12 DIAGNOSIS — Z7901 Long term (current) use of anticoagulants: Secondary | ICD-10-CM | POA: Insufficient documentation

## 2023-04-12 DIAGNOSIS — R058 Other specified cough: Secondary | ICD-10-CM

## 2023-04-12 DIAGNOSIS — Z86711 Personal history of pulmonary embolism: Secondary | ICD-10-CM | POA: Insufficient documentation

## 2023-04-12 DIAGNOSIS — O99511 Diseases of the respiratory system complicating pregnancy, first trimester: Secondary | ICD-10-CM | POA: Diagnosis not present

## 2023-04-12 DIAGNOSIS — R0602 Shortness of breath: Secondary | ICD-10-CM | POA: Insufficient documentation

## 2023-04-12 DIAGNOSIS — Z86718 Personal history of other venous thrombosis and embolism: Secondary | ICD-10-CM | POA: Insufficient documentation

## 2023-04-12 LAB — URINALYSIS, ROUTINE W REFLEX MICROSCOPIC
Bilirubin Urine: NEGATIVE
Glucose, UA: NEGATIVE mg/dL
Hgb urine dipstick: NEGATIVE
Ketones, ur: NEGATIVE mg/dL
Leukocytes,Ua: NEGATIVE
Nitrite: NEGATIVE
Protein, ur: NEGATIVE mg/dL
Specific Gravity, Urine: 1.004 — ABNORMAL LOW (ref 1.005–1.030)
pH: 6 (ref 5.0–8.0)

## 2023-04-12 LAB — COMPREHENSIVE METABOLIC PANEL
ALT: 78 U/L — ABNORMAL HIGH (ref 0–44)
AST: 24 U/L (ref 15–41)
Albumin: 3.4 g/dL — ABNORMAL LOW (ref 3.5–5.0)
Alkaline Phosphatase: 47 U/L (ref 38–126)
Anion gap: 10 (ref 5–15)
BUN: 8 mg/dL (ref 6–20)
CO2: 23 mmol/L (ref 22–32)
Calcium: 9.3 mg/dL (ref 8.9–10.3)
Chloride: 103 mmol/L (ref 98–111)
Creatinine, Ser: 0.62 mg/dL (ref 0.44–1.00)
GFR, Estimated: >60 mL/min (ref >60)
Glucose, Bld: 99 mg/dL (ref 70–99)
Potassium: 4 mmol/L (ref 3.5–5.1)
Sodium: 136 mmol/L (ref 135–145)
Total Bilirubin: 0.4 mg/dL (ref 0.0–1.2)
Total Protein: 6.6 g/dL (ref 6.5–8.1)

## 2023-04-12 LAB — CBC
HCT: 34 % — ABNORMAL LOW (ref 36.0–46.0)
Hemoglobin: 11.7 g/dL — ABNORMAL LOW (ref 12.0–15.0)
MCH: 30.4 pg (ref 26.0–34.0)
MCHC: 34.4 g/dL (ref 30.0–36.0)
MCV: 88.3 fL (ref 80.0–100.0)
Platelets: 376 10*3/uL (ref 150–400)
RBC: 3.85 MIL/uL — ABNORMAL LOW (ref 3.87–5.11)
RDW: 12.4 % (ref 11.5–15.5)
WBC: 10.5 10*3/uL (ref 4.0–10.5)
nRBC: 0 % (ref 0.0–0.2)

## 2023-04-12 LAB — RESP PANEL BY RT-PCR (RSV, FLU A&B, COVID)  RVPGX2
Influenza A by PCR: NEGATIVE
Influenza B by PCR: NEGATIVE
Resp Syncytial Virus by PCR: NEGATIVE
SARS Coronavirus 2 by RT PCR: NEGATIVE

## 2023-04-12 LAB — PROTEIN / CREATININE RATIO, URINE
Creatinine, Urine: 32 mg/dL
Total Protein, Urine: <6 mg/dL

## 2023-04-12 MED ORDER — IPRATROPIUM-ALBUTEROL 0.5-2.5 (3) MG/3ML IN SOLN
3.0000 mL | Freq: Once | RESPIRATORY_TRACT | Status: AC
Start: 2023-04-12 — End: 2023-04-12
  Administered 2023-04-12: 3 mL via RESPIRATORY_TRACT
  Filled 2023-04-12: qty 3

## 2023-04-12 MED ORDER — IPRATROPIUM-ALBUTEROL 0.5-2.5 (3) MG/3ML IN SOLN: 3.0000 mL | Freq: Four times a day (QID) | RESPIRATORY_TRACT | 0 refills | Status: AC | PRN

## 2023-04-12 MED ORDER — GUAIFENESIN ER 600 MG PO TB12
1200.0000 mg | ORAL_TABLET | Freq: Once | ORAL | Status: AC
  Administered 2023-04-12: 1200 mg via ORAL
  Filled 2023-04-12: qty 2

## 2023-04-12 NOTE — MAU Provider Note (Signed)
 History     CSN: [id number]  Arrival date and time: 04/12/23 1026   Event Date/Time   First Provider Initiated Contact with Patient 04/12/23 1123      Chief Complaint  Patient presents with  . Hypertension  . Shortness of Breath  . Chest Pain  . Fatigue  . Headache  . Blurred Vision   HPI Ms. Zonnie Landen is a 38 y.o. year old [id number] female at [redacted]w[redacted]d weeks gestation who presents to MAU reporting H/A, "extreme" fatigue, blurred vision, SOB, cough with "thick, yellowish-white, thick phlegm, and elevated BPs since Thursday 04/08/2023. She reports her BP was 146/90s and 150s/90s. She had cHTN and takes Labetalol 100 mg po BID, but increased that to 200 mg po BID this weekend; last dose was at 0700. 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 She last used her Albuterol inhaler at 0900. She reports she woke up "soaking wet" this morning when the house HVAC system was reading 65 degrees. Her spouse reports that she has also had activity intolerance lately. She reports she "got tired just cooking dinner last night." She has a h/o PE and DVT; on a therapeutic dose of Lovenox 100 mg daily (Last dose this morning). She receives [facility]; next appt is 04/29/2023. She also is being followed by [facility]; next appt is in April 2025. Her spouse is present and contributing to the history taking.    OB History     Gravida  2   Para  1   Term      Preterm  1   AB      Living  1      SAB      IAB      Ectopic      Multiple  0   Live Births  1           Past Medical History:  Diagnosis Date  . Degenerative disc disease, lumbar   . Gestational diabetes   . Gestational hypertension   . Mitral valve prolapse     Past Surgical History:  Procedure Laterality Date  . CESAREAN SECTION N/A 12/02/2020   Procedure: CESAREAN SECTION;  Surgeon: Harold Hedge, MD;   Location: [facility] LD ORS;  Service: Obstetrics;  Laterality: N/A;  . WISDOM TOOTH EXTRACTION      Family History  Problem Relation Age of Onset  . Hypertension Mother   . Thyroid disease Mother   . Cancer Father   . Thyroid disease Maternal Grandmother     Social History   Tobacco Use  . Smoking status: Never  . Smokeless tobacco: Never  Vaping Use  . Vaping status: Never Used  Substance Use Topics  . Alcohol use: Never  . Drug use: Never    Allergies:  Allergies  Allergen Reactions  . Shellfish-Derived Products Anaphylaxis  . Betadine [Povidone Iodine] Itching and Rash  . Latex Rash    Medications Prior to Admission  Medication Sig Dispense Refill Last Dose/Taking  . aspirin EC 81 MG tablet Take 81 mg by mouth daily. Swallow whole.   04/12/2023  . cyclobenzaprine (FLEXERIL) 10 MG tablet Take 1 tablet by mouth 3 times a day as needed 30 tablet 3 Past Week  . enoxaparin (LOVENOX) 100 MG/ML injection Inject 100 mg into the skin every 12 (twelve) hours.   04/12/2023  . labetalol (NORMODYNE) 200 MG tablet  Take 200 mg by mouth 2 (two) times daily.   04/12/2023  . levocetirizine (XYZAL) 5 MG tablet Take 5 mg by mouth every evening.   04/12/2023  . Magnesium Glycinate 100 MG CAPS Take 200 mg by mouth at bedtime.   04/11/2023  . acetaminophen (TYLENOL) 325 MG tablet Take 2 tablets (650 mg total) by mouth every 6 (six) hours as needed. (Patient not taking: Reported on 04/12/2023) 30 tablet 0 Not Taking  . ALPRAZolam (XANAX) 0.25 MG tablet Take 0.25 mg by mouth 2 (two) times daily as needed.     Marland Kitchen amphetamine-dextroamphetamine (ADDERALL) 10 MG tablet Take 10 mg by mouth 2 (two) times daily.     Marland Kitchen apixaban (ELIQUIS) 5 MG TABS tablet 2 (two) times daily.     . Cholecalciferol (VITAMIN D3) 1.25 MG (50000 UT) CAPS Take 1 capsule by mouth once a week.     Marland Kitchen EPINEPHrine 0.3 mg/0.3 mL IJ SOAJ injection Inject 1 pen into the thigh as needed for anaphylaxis. 2 each 3   . famotidine (PEPCID) 20 MG  tablet daily.     . fluticasone (FLONASE) 50 MCG/ACT nasal spray Place 1 spray into both nostrils daily. (Patient not taking: Reported on 04/12/2023) 11.1 mL 2 Not Taking  . losartan (COZAAR) 50 MG tablet daily.     . magnesium oxide (MAG-OX) 400 MG tablet Take 400 mg by mouth daily. (Patient not taking: Reported on 04/12/2023)   Not Taking    Review of Systems  Constitutional:  Positive for fatigue ("extreme").  HENT: Negative.    Eyes: Negative.   Respiratory:  Positive for cough (productive with whitish-yellow, thick sputum) and shortness of breath. Negative for chest tightness (earlier today, but better after using inhaler).   Cardiovascular: Negative.   Gastrointestinal: Negative.   Endocrine: Negative.   Genitourinary: Negative.   Musculoskeletal: Negative.   Skin: Negative.   Allergic/Immunologic: Negative.   Neurological: Negative.   Hematological: Negative.   Psychiatric/Behavioral: Negative.     Physical Exam   Blood pressure 131/81, pulse (!) 56, temperature 97.6 F (36.4 C), temperature source Oral, resp. rate 18, height 5\' 3"  (1.6 m), weight 110.7 kg, SpO2 100%, unknown if currently breastfeeding.  Physical Exam Vitals reviewed.  Constitutional:      Appearance: Normal appearance. She is obese.  Cardiovascular:     Rate and Rhythm: Regular rhythm. Bradycardia present.     Pulses: Normal pulses.     Heart sounds: Normal heart sounds.  Pulmonary:     Effort: Accessory muscle usage present.     Breath sounds: Normal breath sounds.  Abdominal:     General: Bowel sounds are normal.     Palpations: Abdomen is soft.  Genitourinary:    Comments: Not indicated Musculoskeletal:        General: Normal range of motion.  Skin:    General: Skin is warm and dry.  Neurological:     Mental Status: She is alert and oriented to person, place, and time.  Psychiatric:        Mood and Affect: Mood normal.        Behavior: Behavior normal.        Thought Content: Thought  content normal.        Judgment: Judgment normal.   MAU Course  Procedures Patient informed that the ultrasound is considered a limited OB ultrasound and is not intended to be a complete ultrasound exam.  Patient also informed that the ultrasound is not being completed with the  intent of assessing for fetal or placental anomalies or any pelvic abnormalities.  Explained that the purpose of today's ultrasound is to assess for viability.  FHR 170 bpm. Baby was found to be viable and very active. Patient and spouse very reassured. Picture printed for them to keep. Patient acknowledges the purpose of the exam and the limitations of the study.  MDM CCUA CCUA CBC CMP P/C Ratio Serial BP's   Results for orders placed or performed during the hospital encounter of 04/12/23 (from the past 24 hours)  Urinalysis, Routine w reflex microscopic -Urine, Clean Catch     Status: Abnormal   Collection Time: 04/12/23 10:55 AM  Result Value Ref Range   Color, Urine STRAW (A) YELLOW   APPearance CLEAR CLEAR   Specific Gravity, Urine 1.004 (L) 1.005 - 1.030   pH 6.0 5.0 - 8.0   Glucose, UA NEGATIVE NEGATIVE mg/dL   Hgb urine dipstick NEGATIVE NEGATIVE   Bilirubin Urine NEGATIVE NEGATIVE   Ketones, ur NEGATIVE NEGATIVE mg/dL   Protein, ur NEGATIVE NEGATIVE mg/dL   Nitrite NEGATIVE NEGATIVE   Leukocytes,Ua NEGATIVE NEGATIVE  Protein / creatinine ratio, urine     Status: None   Collection Time: 04/12/23 11:34 AM  Result Value Ref Range   Creatinine, Urine 32 mg/dL   Total Protein, Urine <6 mg/dL   Protein Creatinine Ratio        0.00 - 0.15 mg/mg[Cre]  CBC     Status: Abnormal   Collection Time: 04/12/23 11:53 AM  Result Value Ref Range   WBC 10.5 4.0 - 10.5 K/uL   RBC 3.85 (L) 3.87 - 5.11 MIL/uL   Hemoglobin 11.7 (L) 12.0 - 15.0 g/dL   HCT 16.1 (L) 09.6 - 04.5 %   MCV 88.3 80.0 - 100.0 fL   MCH 30.4 26.0 - 34.0 pg   MCHC 34.4 30.0 - 36.0 g/dL   RDW 40.9 81.1 - 91.4 %   Platelets 376 150 - 400  K/uL   nRBC 0.0 0.0 - 0.2 %  Comprehensive metabolic panel     Status: Abnormal   Collection Time: 04/12/23 11:53 AM  Result Value Ref Range   Sodium 136 135 - 145 mmol/L   Potassium 4.0 3.5 - 5.1 mmol/L   Chloride 103 98 - 111 mmol/L   CO2 23 22 - 32 mmol/L   Glucose, Bld 99 70 - 99 mg/dL   BUN 8 6 - 20 mg/dL   Creatinine, Ser 7.82 0.44 - 1.00 mg/dL   Calcium 9.3 8.9 - 95.6 mg/dL   Total Protein 6.6 6.5 - 8.1 g/dL   Albumin 3.4 (L) 3.5 - 5.0 g/dL   AST 24 15 - 41 U/L   ALT 78 (H) 0 - 44 U/L   Alkaline Phosphatase 47 38 - 126 U/L   Total Bilirubin 0.4 0.0 - 1.2 mg/dL   GFR, Estimated >21 >30 mL/min   Anion gap 10 5 - 15   DG Chest Port 1 View Result Date: 04/12/2023 CLINICAL DATA:  Shortness of breath.  Cough. EXAM: PORTABLE CHEST 1 VIEW COMPARISON:  None Available. FINDINGS: Bilateral lung fields are clear. Bilateral costophrenic angles are clear. Normal cardio-mediastinal silhouette. No acute osseous abnormalities. The soft tissues are within normal limits. IMPRESSION: No active disease. Electronically Signed   By: Jules Schick M.D.   On: 04/12/2023 14:37   Assessment and Plan  1. Upper respiratory symptom (Primary) - Information provided on URI   2. Cough productive of yellow sputum - Advised  to purchase OTC Mucinex 600 mg BID - Safe Meds in Preg list given  3. Shortness of breath due to pregnancy in first trimester - Prescription for: Duoneb solution for breathing treatment machine every 6 hrs PRN, if Albuterol inhaler not sufficiently treating SOB - Information provided on SOB in adult   4. [redacted] weeks gestation of pregnancy   - Discharge home - Keep scheduled appt with [facility] on 04/29/2023 - Patient verbalized an understanding of the plan of care and agrees.   Raelyn Mora, CNM 04/12/2023, 11:23 AM

## 2023-04-12 NOTE — Discharge Instructions (Signed)
 Safe Medications in Pregnancy   Acne: Benzoyl Peroxide Salicylic Acid  Backache/Headache: Tylenol: 2 regular strength every 4 hours OR              2 Extra strength every 6 hours  Colds/Coughs/Allergies: Benadryl (alcohol free) 25 mg every 6 hours as needed Breath right strips Claritin Cepacol throat lozenges Chloraseptic throat spray Cold-Eeze- up to three times per day Cough drops, alcohol free Flonase (by prescription only) Guaifenesin Mucinex Robitussin DM (plain only, alcohol free) Saline nasal spray/drops Sudafed (pseudoephedrine) & Actifed ** use only after [redacted] weeks gestation and if you do not have high blood pressure Tylenol Vicks Vaporub Zinc lozenges Zyrtec   Constipation: Colace Ducolax suppositories Fleet enema Glycerin suppositories Metamucil Milk of magnesia Miralax Senokot Smooth move tea  Diarrhea: Kaopectate Imodium A-D  *NO pepto Bismol  Hemorrhoids: Anusol Anusol HC Preparation H Tucks  Indigestion: Tums Maalox Mylanta Zantac  Pepcid  Insomnia: Benadryl (alcohol free) 25mg  every 6 hours as needed Tylenol PM Unisom, no Gelcaps  Leg Cramps: Tums MagGel  Nausea/Vomiting:  Bonine Dramamine Emetrol Ginger extract Sea bands Meclizine  Nausea medication to take during pregnancy:  Unisom (doxylamine succinate 25 mg tablets) Take one tablet daily at bedtime. If symptoms are not adequately controlled, the dose can be increased to a maximum recommended dose of two tablets daily (1/2 tablet in the morning, 1/2 tablet mid-afternoon and one at bedtime). Vitamin B6 100mg  tablets. Take one tablet twice a day (up to 200 mg per day).  Skin Rashes: Aveeno products Benadryl cream or 25mg  every 6 hours as needed Calamine Lotion 1% cortisone cream  Yeast infection: Gyne-lotrimin 7 Monistat 7   **If taking multiple medications, please check labels to avoid duplicating the same active ingredients **take medication as directed on  the label ** Do not exceed 4000 mg of tylenol in 24 hours **Do not take medications that contain aspirin or ibuprofen     **PURCHASE ANY OF THE OVER-THE-COUNTER MEDICATIONS FOR UPPER RESPIRATORY INFECTIONS** I RECOMMEND MUCINEX 600 MG TWICE DAILY.

## 2023-04-12 NOTE — MAU Note (Addendum)
.  [name] is a 38 y.o. at [redacted]w[redacted]d here in MAU reporting: sent by office for BP workup - she reports known essential HTN and on Labetalol. Has headache, "extreme" fatigue, blurred vision, and SOB. Reports Bps of 146/90's. Pt reports "upping" her Labetalol from 100 mg to 200 mg BID - reports prior discussion with Dr. Vincente Poli on increasing dose if pressures not well controlled. Patient believes that she may have pneumonia. Has had a sinus infection for the past two weeks that has not gotten better. Reports continued substernal chest pain, congestion, and chills. Denies VB or abnormal vaginal discharge/LOF.  Hx of PE and DVT - taking Lovenox 100 mg BID.  LMP: N/A Onset of complaint: on-going for two weeks Pain score: see flowsheets Vitals:   04/12/23 1046  BP: 131/81  Pulse: (!) 56  Resp: 18  Temp: 97.6 F (36.4 C)  SpO2: 100%     FHT: attempted, unable to obtain; will inform provider  Lab orders placed from triage: UA

## 2023-04-12 NOTE — MAU Note (Signed)
 RN called RT for nebulizer treatment - will come down when able. Radiology at bedside for X-ray.

## 2023-04-13 ENCOUNTER — Encounter: Payer: Self-pay | Admitting: Obstetrics and Gynecology

## 2023-06-03 ENCOUNTER — Ambulatory Visit (HOSPITAL_BASED_OUTPATIENT_CLINIC_OR_DEPARTMENT_OTHER): Admitting: Family

## 2023-06-03 VITALS — BP 120/76 | HR 83 | Ht 63.0 in | Wt 250.6 lb

## 2023-06-03 DIAGNOSIS — O09292 Supervision of pregnancy with other poor reproductive or obstetric history, second trimester: Secondary | ICD-10-CM | POA: Diagnosis not present

## 2023-06-03 DIAGNOSIS — Z3A19 19 weeks gestation of pregnancy: Secondary | ICD-10-CM

## 2023-06-03 DIAGNOSIS — Z86718 Personal history of other venous thrombosis and embolism: Secondary | ICD-10-CM

## 2023-06-03 DIAGNOSIS — D6859 Other primary thrombophilia: Secondary | ICD-10-CM | POA: Diagnosis not present

## 2023-06-03 DIAGNOSIS — R002 Palpitations: Secondary | ICD-10-CM

## 2023-06-03 DIAGNOSIS — Z86711 Personal history of pulmonary embolism: Secondary | ICD-10-CM

## 2023-06-03 DIAGNOSIS — I10 Essential (primary) hypertension: Secondary | ICD-10-CM

## 2023-06-03 DIAGNOSIS — R001 Bradycardia, unspecified: Secondary | ICD-10-CM | POA: Diagnosis not present

## 2023-06-03 DIAGNOSIS — I80202 Phlebitis and thrombophlebitis of unspecified deep vessels of left lower extremity: Secondary | ICD-10-CM

## 2023-06-03 NOTE — Patient Instructions (Signed)
 Medication Instructions:  Your physician recommends that you continue on your current medications as directed. Please refer to the Current Medication list given to you today.   Testing/Procedures: Your physician has requested that you have an echocardiogram in [place] within the next two weeks . Echocardiography is a painless test that uses sound waves to create images of your heart. It provides your doctor with information about the size and shape of your heart and how well your heart's chambers and valves are working. This procedure takes approximately one hour. There are no restrictions for this procedure. Please do NOT wear cologne, perfume, aftershave, or lotions (deodorant is allowed). Please arrive 15 minutes prior to your appointment time.  Please note: We ask at that you not bring children with you during ultrasound (echo/ vascular) testing. Due to room size and safety concerns, children are not allowed in the ultrasound rooms during exams. Our front office staff cannot provide observation of children in our lobby area while testing is being conducted. An adult accompanying a patient to their appointment will only be allowed in the ultrasound room at the discretion of the ultrasound technician under special circumstances. We apologize for any inconvenience.   Follow-Up: Please follow up in 6-8 weeks ( virtual or in person)  with Dr. Veryl Gottron, Slater Duncan, NP or Neomi Banks, NP

## 2023-06-03 NOTE — Progress Notes (Signed)
 Cardiology Office Note:  .    Date:  06/03/2023  ID:  [name], DOB 04/10/1985, MRN [id number] PCP: Ander Bame, FNP  [facility] Providers Cardiologist:  Sheryle Donning, MD     History of Present Illness: [name]    [name] is a 38 y.o. 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   Previous evaluation LLE duplex from 11/10/22 showed occlusive DVT in left peroneal veins and PE in RLL on 11/08/22 imaging at [facility].   Established with Dr. Veryl Gottron 11/24/22 after aforementioned hospitalization where she presented with syncope and woke up with slurred speech leading to stroke workup. A few days before her DVT was found, she was at work in the setting of the air conditioning malfunctioning. She also noted a new onset bradycardia (prior heart rate 90-100s). Subsequent 4 day monitor with predominantly NSR average heart rate of 82 bpm with <1% PAC/PVC burden and triggered events NSR or single PVC. No high risk arrhythmias detected.   Seen in MAU on 04/12/23, at [redacted] weeks gestation, reporting headache, fatigue, blurred vision, shortness of breath, cough with sputum, and elevated blood pressures. Treated for URI.  Follows with [facility], given history of preterm premature rupture of membranes at 33 weeks, history of VTE on anticoagulation, and advanced maternal age. At last noted 05/27/23, noted that she was following with hematology for her management of anticoagulation with lovenox, stopped nifedipine due to headaches, managing with labetalol  alone. Noted BP spike with caffeine intake.  Presents today independently. She is [redacted] weeks pregnant and due in September with her second son. Her other son is 16.16 years old. She is working as an NP in a GYN setting. Her OB has been managing her labetolol and BP has been at goal <130/80 on 200mg  BID when checked ~3 times per week at home.   Reports no edema, had edema with prior pregnancy at 25 weeks. Her MFM team has requested updated echo for baseline. She does note some exertional dyspnea which she attributes to weather/asthma as it improves with inhaler. No orthopnea, PND, chest pain.   Previous antihypertensives Nifedipine - headache Losartan  ROS:  Please see the history of present illness.    All other systems reviewed and are negative.   Studies Reviewed: [name]         Physical Exam:    VS:  BP 120/76   Pulse 83   Ht 5\' 3"  (1.6 m)   Wt 250 lb 9.6 oz (113.7 kg)   SpO2 97%   BMI 44.39 kg/m    Wt Readings from Last 3 Encounters:  06/03/23 250 lb 9.6 oz (113.7 kg)  04/12/23 244 lb (110.7 kg)  11/24/22 251 lb (113.9 kg)    GEN: Well nourished, well developed in no acute distress HEENT: Normal, moist mucous membranes NECK: No JVD CARDIAC: regular rhythm, normal S1 and S2, no rubs or gallops. No murmur. VASCULAR: Radial and DP pulses 2+ bilaterally. No carotid bruits RESPIRATORY:  Clear to auscultation without rales, wheezing or rhonchi  ABDOMEN: Soft, non-tender, non-distended MUSCULOSKELETAL:  Ambulates independently SKIN: Warm and dry, no edema NEUROLOGIC:  Alert and oriented x 3. No focal neuro deficits noted. PSYCHIATRIC:  Normal affect   ASSESSMENT AND PLAN: .    Assessment & Plan Pregnancy with history of preeclampsia / HTN  19 weeks and 5 days pregnant with history of preeclampsia. Under care of maternal fetal medicine and  OB. - Continue regular follow-ups with maternal fetal medicine and OB. -Continue Labeilol 200mg  BID. Can further uptitrate as needed for BP control. Previously did not tolerate Nifedipine.  - Monitor blood pressure at home a few times per week. - Contact healthcare provider if blood pressure consistently exceeds 130/80 mmHg. -Update echo for baseline  History of pulmonary embolism/DVT/ Hypercoagulable state On Lovenox. Follows with hematology.   Asthma Childhood asthma with  recent exacerbations due to environmental factors. Symptoms activity-induced, relieved by inhaler. No nocturnal symptoms or orthopnea. - Continue using inhaler as needed for asthma symptoms. - Monitor for any changes in symptoms or increased frequency of inhaler use. -Continue to follow with PCP.   Dispo: follow up in 6-8 weeks in person or virtual to review echo, ensure BP controlled  Signed, Clearnce Curia, NP

## 2023-06-04 ENCOUNTER — Encounter (HOSPITAL_BASED_OUTPATIENT_CLINIC_OR_DEPARTMENT_OTHER): Payer: Self-pay | Admitting: Family

## 2023-06-24 ENCOUNTER — Ambulatory Visit (INDEPENDENT_AMBULATORY_CARE_PROVIDER_SITE_OTHER)

## 2023-06-24 DIAGNOSIS — R001 Bradycardia, unspecified: Secondary | ICD-10-CM

## 2023-06-24 DIAGNOSIS — Z86718 Personal history of other venous thrombosis and embolism: Secondary | ICD-10-CM

## 2023-06-24 LAB — ECHOCARDIOGRAM COMPLETE
Area-P 1/2: 4.6 cm2
S' Lateral: 3.03 cm

## 2023-06-25 ENCOUNTER — Ambulatory Visit (HOSPITAL_BASED_OUTPATIENT_CLINIC_OR_DEPARTMENT_OTHER): Payer: Self-pay | Admitting: Family

## 2023-07-15 ENCOUNTER — Inpatient Hospital Stay (HOSPITAL_COMMUNITY)
Admission: AD | Admit: 2023-07-15 | Discharge: 2023-07-15 | Disposition: A | Discharge status: Home / Self Care | Attending: Obstetrics and Gynecology | Admitting: Obstetrics and Gynecology

## 2023-07-15 ENCOUNTER — Encounter (HOSPITAL_COMMUNITY): Payer: Self-pay | Admitting: Obstetrics and Gynecology

## 2023-07-15 DIAGNOSIS — O09522 Supervision of elderly multigravida, second trimester: Secondary | ICD-10-CM | POA: Diagnosis not present

## 2023-07-15 DIAGNOSIS — O26872 Cervical shortening, second trimester: Secondary | ICD-10-CM

## 2023-07-15 DIAGNOSIS — O26873 Cervical shortening, third trimester: Secondary | ICD-10-CM

## 2023-07-15 DIAGNOSIS — Z3A25 25 weeks gestation of pregnancy: Secondary | ICD-10-CM | POA: Diagnosis not present

## 2023-07-15 MED ORDER — BETAMETHASONE SOD PHOS & ACET 6 (3-3) MG/ML IJ SUSP
12.0000 mg | Freq: Once | INTRAMUSCULAR | Status: AC
  Administered 2023-07-15: 12 mg via INTRAMUSCULAR
  Filled 2023-07-15: qty 5

## 2023-07-15 NOTE — MAU Note (Signed)
 [name] is a 38 y.o. at [redacted]w[redacted]d here in MAU reporting: sent from office for Betamethasone  injections.  Cx is closed externally, internal funnelling.Hx of PPROM with first.. Pain score: none Vitals:   07/15/23 1401  BP: 128/81  Pulse: 88  Resp: 17  Temp: 98.4 F (36.9 C)  SpO2: 100%     FHT:148 Lab orders placed from triage:

## 2023-07-15 NOTE — MAU Provider Note (Signed)
 Event Date/Time   First Provider Initiated Contact with Patient 07/15/23 1457      S [name] is a 38 y.o. G2P0101 patient who presents to MAU today for Betamethasone  injection for known shortened cervix. She reports she was seen at [facility] yesterday where her cervix was found to be funneled all the way to the internal os. She opted to go to the triage area at [facility] yesterday for further evaluation. She reports she was there all day and had 2 cervical exams   O BP 128/81 (BP Location: Right Arm)   Pulse 88   Temp 98.4 F (36.9 C) (Oral)   Resp 17   Ht 5' 3 (1.6 m)   Wt 73.6 kg   SpO2 100%   BMI 28.75 kg/m   Physical Exam Vitals and nursing note reviewed.  Constitutional:      Appearance: Normal appearance. She is obese.   Cardiovascular:     Rate and Rhythm: Normal rate.  Pulmonary:     Effort: Pulmonary effort is normal.  Genitourinary:    Comments: deferred  Musculoskeletal:        General: Normal range of motion.   Skin:    General: Skin is warm and dry.   Neurological:     Mental Status: She is alert and oriented to person, place, and time.   Psychiatric:        Mood and Affect: Mood normal.        Behavior: Behavior normal.        Thought Content: Thought content normal.        Judgment: Judgment normal.    FHTs by doppler: 148 bpm  A Medical screening exam complete 1. Short cervix during pregnancy in third trimester (Primary) 2. [redacted] weeks gestation of pregnancy   P Discharge from MAU in stable condition Return to MAU tomorrow for 2nd injection of BMZ Warning signs for worsening condition that would warrant emergency follow-up discussed Patient may return to MAU as needed for pregnancy related issues Patient verbalized an understanding of the plan of care and agrees.   Almond Army, CNM 07/15/2023 2:57 PM

## 2023-07-16 ENCOUNTER — Inpatient Hospital Stay (HOSPITAL_COMMUNITY)
Admission: AD | Admit: 2023-07-16 | Discharge: 2023-07-16 | Disposition: A | Discharge status: Home / Self Care | Payer: Self-pay | Attending: Obstetrics and Gynecology | Admitting: Obstetrics and Gynecology

## 2023-07-16 ENCOUNTER — Other Ambulatory Visit: Payer: Self-pay

## 2023-07-16 ENCOUNTER — Encounter (HOSPITAL_COMMUNITY): Payer: Self-pay | Admitting: Obstetrics and Gynecology

## 2023-07-16 DIAGNOSIS — Z3A25 25 weeks gestation of pregnancy: Secondary | ICD-10-CM

## 2023-07-16 DIAGNOSIS — Z8759 Personal history of other complications of pregnancy, childbirth and the puerperium: Secondary | ICD-10-CM

## 2023-07-16 DIAGNOSIS — O10919 Unspecified pre-existing hypertension complicating pregnancy, unspecified trimester: Secondary | ICD-10-CM

## 2023-07-16 DIAGNOSIS — O26892 Other specified pregnancy related conditions, second trimester: Secondary | ICD-10-CM | POA: Insufficient documentation

## 2023-07-16 DIAGNOSIS — Z86718 Personal history of other venous thrombosis and embolism: Secondary | ICD-10-CM | POA: Diagnosis not present

## 2023-07-16 DIAGNOSIS — O3432 Maternal care for cervical incompetence, second trimester: Secondary | ICD-10-CM | POA: Diagnosis not present

## 2023-07-16 DIAGNOSIS — N883 Incompetence of cervix uteri: Secondary | ICD-10-CM

## 2023-07-16 DIAGNOSIS — O09292 Supervision of pregnancy with other poor reproductive or obstetric history, second trimester: Secondary | ICD-10-CM | POA: Insufficient documentation

## 2023-07-16 DIAGNOSIS — Z86711 Personal history of pulmonary embolism: Secondary | ICD-10-CM | POA: Insufficient documentation

## 2023-07-16 MED ORDER — BETAMETHASONE SOD PHOS & ACET 6 (3-3) MG/ML IJ SUSP
12.0000 mg | Freq: Once | INTRAMUSCULAR | Status: AC
  Administered 2023-07-16: 12 mg via INTRAMUSCULAR
  Filled 2023-07-16: qty 5

## 2023-07-16 NOTE — MAU Note (Signed)
.  [name] is a 38 y.o. at [redacted]w[redacted]d here in MAU reporting: She is here for betamethasone , but would like to be seen today because she feels like there is a golf ball sitting in her vagina and would like to be checked.  Denies vaginal bleeding, LOF, reports +FM Patient reports she feels like she needs to have a bowel movement, but has not attempted because of this feeling.  Onset of complaint: today  Pain score: denies There were no vitals filed for this visit.    Lab orders placed from triage:   none

## 2023-07-16 NOTE — MAU Provider Note (Signed)
 History     CSN: [id number]  Arrival date and time: 07/16/23 1309   Event Date/Time   First Provider Initiated Contact with Patient 07/16/23 1442      Chief Complaint  Patient presents with   Pelvic Pain   HPI  Ms.[name] Mara is a 38 y.o. female G56P0101 @ [redacted]w[redacted]d receiving high risk [facility] at [facility] and [facility]. She reports she is here for her 2nd dose of BMZ. She was diagnosed with incompetent cervix/dynamic cervix. Last cervical length was 2.3 CM. She requests to have her cervix checked prior to DC. She reports some increased pelvic pressure. No bleeding.   Also on Lovenox BID for history of DVT/PE in 2024  H/O 34 week delivery via C/s secondary to PPROM.   OB History     Gravida  2   Para  1   Term      Preterm  1   AB      Living  1      SAB      IAB      Ectopic      Multiple  0   Live Births  1           Past Medical History:  Diagnosis Date   Degenerative disc disease, lumbar    Gestational diabetes    Gestational hypertension    Mitral valve prolapse     Past Surgical History:  Procedure Laterality Date   CESAREAN SECTION N/A 12/02/2020   Procedure: CESAREAN SECTION;  Surgeon: Thora Flint, MD;  Location: [facility] LD ORS;  Service: Obstetrics;  Laterality: N/A;   WISDOM TOOTH EXTRACTION      Family History  Problem Relation Age of Onset   Hypertension Mother    Thyroid disease Mother    Cancer Father    Thyroid disease Maternal Grandmother     Social History   Tobacco Use   Smoking status: Never   Smokeless tobacco: Never  Vaping Use   Vaping status: Never Used  Substance Use Topics   Alcohol use: Never   Drug use: Never    Allergies:  Allergies  Allergen Reactions   Shellfish-Derived Products Anaphylaxis   Betadine [Povidone Iodine] Itching and Rash   Latex Rash    Medications Prior to Admission  Medication Sig Dispense Refill Last Dose/Taking   acetaminophen  (TYLENOL ) 325 MG tablet Take 2  tablets (650 mg total) by mouth every 6 (six) hours as needed. 30 tablet 0 Past Month   aspirin EC 81 MG tablet Take 81 mg by mouth daily. Swallow whole.   07/16/2023   cyclobenzaprine  (FLEXERIL ) 10 MG tablet Take 1 tablet by mouth 3 times a day as needed 30 tablet 3 07/15/2023   enoxaparin (LOVENOX) 100 MG/ML injection Inject 100 mg into the skin every 12 (twelve) hours.   07/16/2023   famotidine  (PEPCID ) 20 MG tablet daily.   07/15/2023   labetalol  (NORMODYNE ) 200 MG tablet Take 200 mg by mouth 2 (two) times daily.   07/16/2023   levocetirizine (XYZAL) 5 MG tablet Take 5 mg by mouth every evening.   07/15/2023   magnesium  oxide (MAG-OX) 400 (240 Mg) MG tablet Take 500 mg by mouth daily. At bedtime   07/15/2023   polyethylene glycol (MIRALAX / GLYCOLAX) 17 g packet Take 17 g by mouth daily.   Past Month   senna (SENOKOT) 8.6 MG TABS tablet Take 1 tablet by mouth.   07/16/2023   Cholecalciferol (VITAMIN D3) 1.25 MG (  50000 UT) CAPS Take 1 capsule by mouth once a week. (Patient not taking: Reported on 06/03/2023)      EPINEPHrine  0.3 mg/0.3 mL IJ SOAJ injection Inject 1 pen into the thigh as needed for anaphylaxis. 2 each 3 More than a month   fluticasone  (FLONASE ) 50 MCG/ACT nasal spray Place 1 spray into both nostrils daily. (Patient not taking: Reported on 04/12/2023) 11.1 mL 2    ipratropium-albuterol  (DUONEB) 0.5-2.5 (3) MG/3ML SOLN Take 3 mLs by nebulization every 6 (six) hours as needed. 360 mL 0 More than a month   Magnesium  Glycinate 100 MG CAPS Take 200 mg by mouth at bedtime. (Patient not taking: Reported on 06/03/2023)      No results found for this or any previous visit (from the past 48 hours).   Review of Systems  Constitutional:  Negative for fever.  Gastrointestinal:  Positive for abdominal pain.  Genitourinary:  Positive for pelvic pain. Negative for vaginal bleeding.   Physical Exam   Blood pressure 129/79, pulse 84, temperature 97.8 F (36.6 C), resp. rate 16, SpO2 99%, unknown if  currently breastfeeding.  Physical Exam Constitutional:      General: She is not in acute distress.    Appearance: Normal appearance. She is not ill-appearing, toxic-appearing or diaphoretic.  Genitourinary:    Comments: Dilation: Closed, posterior  Exam by:: Dub Ghazi, NP   Neurological:     Mental Status: She is alert and oriented to person, place, and time.   Psychiatric:        Behavior: Behavior normal.    Fetal Tracing  Baseline: 140 bpm Variability: Moderate  Accelerations: 10x10 Decelerations: none Toco: mild UI   MAU Course  Procedures  MDM  BMZ # 2 given Cervix closed   Assessment and Plan   A:  1. History of preterm premature rupture of membranes (PPROM)   2. Incompetent cervix   3. [redacted] weeks gestation of pregnancy     P:  Dc home Pelvic rest Keep close f/u with OB and [facility] Continue lovenox. Return to MAU if symptoms worsen  Sharlynn Seckinger, Juliette Oh, NP 07/16/2023 8:42 PM

## 2023-08-13 ENCOUNTER — Encounter (HOSPITAL_BASED_OUTPATIENT_CLINIC_OR_DEPARTMENT_OTHER): Payer: Self-pay | Admitting: Family

## 2023-08-13 ENCOUNTER — Telehealth (HOSPITAL_BASED_OUTPATIENT_CLINIC_OR_DEPARTMENT_OTHER): Admitting: Family

## 2023-08-13 VITALS — BP 120/81 | HR 79 | Ht 63.0 in | Wt 258.0 lb

## 2023-08-13 DIAGNOSIS — I10 Essential (primary) hypertension: Secondary | ICD-10-CM | POA: Diagnosis not present

## 2023-08-13 NOTE — Progress Notes (Signed)
  Patient Consent for Virtual Visit         [name] has provided verbal consent on 08/13/2023 for a virtual visit (video or telephone).   CONSENT FOR VIRTUAL VISIT FOR:  [name]  By participating in this virtual visit I agree to the following:  I hereby voluntarily request, consent and authorize [facility] and its employed or contracted physicians, physician assistants, nurse practitioners or other licensed health care professionals (the Practitioner), to provide me with telemedicine health care services (the "Services) as deemed necessary by the treating Practitioner. I acknowledge and consent to receive the Services by the Practitioner via telemedicine. I understand that the telemedicine visit will involve communicating with the Practitioner through live audiovisual communication technology and the disclosure of certain medical information by electronic transmission. I acknowledge that I have been given the opportunity to request an in-person assessment or other available alternative prior to the telemedicine visit and am voluntarily participating in the telemedicine visit.  I understand that I have the right to withhold or withdraw my consent to the use of telemedicine in the course of my care at any time, without affecting my right to future care or treatment, and that the Practitioner or I may terminate the telemedicine visit at any time. I understand that I have the right to inspect all information obtained and/or recorded in the course of the telemedicine visit and may receive copies of available information for a reasonable fee.  I understand that some of the potential risks of receiving the Services via telemedicine include:  Delay or interruption in medical evaluation due to technological equipment failure or disruption; Information transmitted may not be sufficient (e.g. poor resolution of images) to allow for appropriate medical decision making by the  Practitioner; and/or  In rare instances, security protocols could fail, causing a breach of personal health information.  Furthermore, I acknowledge that it is my responsibility to provide information about my medical history, conditions and care that is complete and accurate to the best of my ability. I acknowledge that Practitioner's advice, recommendations, and/or decision may be based on factors not within their control, such as incomplete or inaccurate data provided by me or distortions of diagnostic images or specimens that may result from electronic transmissions. I understand that the practice of medicine is not an exact science and that Practitioner makes no warranties or guarantees regarding treatment outcomes. I acknowledge that a copy of this consent can be made available to me via my patient portal [facility] MyChart), or I can request a printed copy by calling the office of [facility].    I understand that my insurance will be billed for this visit.   I have read or had this consent read to me. I understand the contents of this consent, which adequately explains the benefits and risks of the Services being provided via telemedicine.  I have been provided ample opportunity to ask questions regarding this consent and the Services and have had my questions answered to my satisfaction. I give my informed consent for the services to be provided through the use of telemedicine in my medical care

## 2023-08-13 NOTE — Patient Instructions (Signed)
 Medication Instructions:  Continue your current medications  *If you need a refill on your cardiac medications before your next appointment, please call your pharmacy*  Follow-Up: At [facility], you and your health needs are our priority.  As part of our continuing mission to provide you with exceptional heart care, our providers are all part of one team.  This team includes your primary Cardiologist (physician) and Advanced Practice Providers or APPs (Physician Assistants and Nurse Practitioners) who all work together to provide you with the care you need, when you need it.  Your next appointment:   As needed with Dr. Lonni or Reche GORMAN Finder, NP   We recommend signing up for the patient portal called MyChart.  Sign up information is provided on this After Visit Summary.  MyChart is used to connect with patients for Virtual Visits (Telemedicine).  Patients are able to view lab/test results, encounter notes, upcoming appointments, etc.  Non-urgent messages can be sent to your provider as well.   To learn more about what you can do with MyChart, go to [email or web address].   Other Instructions Your echocardiogram looked fantastic!

## 2023-08-13 NOTE — Progress Notes (Signed)
 Virtual Visit via Video Note   Because of Robin Forgy's co-morbid illnesses, she is at least at moderate risk for complications without adequate follow up.  This format is felt to be most appropriate for this patient at this time.  All issues noted in this document were discussed and addressed.  A limited physical exam was performed with this format.  Please refer to the patient's chart for her consent to telehealth for [facility].       Date:  08/13/2023   ID:  [name], DOB 1985/08/15, MRN [id number] The patient was identified using 2 identifiers.  Patient Location: Home Provider Location: Office/Clinic   PCP:  Leavy Waddell NOVAK, FNP   [facility] Providers Cardiologist:  Shelda Bruckner, MD     Evaluation Performed:  Follow-Up Visit  Chief Complaint:  follow up after echocardiogram  History of Present Illness:    [name] is a 38 y.o. 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    Previous evaluation LLE duplex from 11/10/22 showed occlusive DVT in left peroneal veins and PE in RLL on 11/08/22 imaging at [facility].    Established with Dr. Bruckner 11/24/22 after aforementioned hospitalization where she presented with syncope and woke up with slurred speech leading to stroke workup. A few days before her DVT was found, she was at work in the setting of the air conditioning malfunctioning. She also noted a new onset bradycardia (prior heart rate 90-100s). Subsequent 4 day monitor with predominantly NSR average heart rate of 82 bpm with <1% PAC/PVC burden and triggered events NSR or single PVC. No high risk arrhythmias detected.    Seen in MAU on 04/12/23, at [redacted] weeks gestation, reporting headache, fatigue, blurred vision, shortness of breath, cough with sputum, and elevated blood pressures. Treated for URI.   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 At last noted 05/27/23, noted that she was following with hematology for her management of anticoagulation with lovenox, stopped nifedipine due to headaches, managing with labetalol  alone. Noted BP spike with caffeine intake.  Last seen 06/03/23. She was [redacted] weeks pregnant and due in September with her second son.  Her OB had been managing her labetolol and BP has been at goal <130/80 on 200mg  BID when checked ~3 times per week at home.   Her MFM team requested updated echo for baseline.  Echocardiogram 06/24/2023 normal LVEF 55 to 60%, no RWMA, normal diastolic parameters, trivial MR.  Follow-up today independently via video visit.  Since last seen due to incompetent cervix and needing to have reduced activity has had to leave her job.  She is presently home taking care of her 31 and half-year-old son, nesting.  She has been feeling very well and reach is [redacted] weeks gestation tomorrow.  BP at home checked 1-2 times per day with readings routinely less than 130/80.  More recently iron was low which was causing some fatigue which has improved with irron supplement. Continues on Labetolol 200mg  BID. She has no LE edema, palpitations.    Previous antihypertensives Nifedipine - headache Losartan   Past Medical History:  Diagnosis Date   Degenerative disc disease, lumbar    Gestational diabetes    Gestational hypertension    Mitral valve prolapse    Past Surgical History:  Procedure Laterality Date  CESAREAN SECTION N/A 12/02/2020   Procedure: CESAREAN SECTION;  Surgeon: Curlene Agent, MD;  Location: [facility] LD ORS;  Service: Obstetrics;  Laterality: N/A;   WISDOM TOOTH EXTRACTION       Current Meds  Medication Sig   acetaminophen  (TYLENOL ) 325 MG tablet Take 2 tablets (650 mg total) by mouth every 6 (six) hours as needed.   aspirin EC 81 MG tablet Take 81 mg by mouth  daily. Swallow whole.   cyclobenzaprine  (FLEXERIL ) 10 MG tablet Take 1 tablet by mouth 3 times a day as needed   enoxaparin (LOVENOX) 100 MG/ML injection Inject 100 mg into the skin every 12 (twelve) hours.   EPINEPHrine  0.3 mg/0.3 mL IJ SOAJ injection Inject 1 pen into the thigh as needed for anaphylaxis. (Patient taking differently: Inject 0.3 mg into the muscle as needed for anaphylaxis.)   famotidine  (PEPCID ) 20 MG tablet daily. (Patient taking differently: Take 20 mg by mouth as needed for heartburn or indigestion.)   ferrous sulfate 325 (65 FE) MG EC tablet Take 1 tablet by mouth daily with breakfast.   ipratropium-albuterol  (DUONEB) 0.5-2.5 (3) MG/3ML SOLN Take 3 mLs by nebulization every 6 (six) hours as needed.   labetalol  (NORMODYNE ) 200 MG tablet Take 200 mg by mouth 2 (two) times daily.   levocetirizine (XYZAL) 5 MG tablet Take 5 mg by mouth every evening.   magnesium  oxide (MAG-OX) 400 (240 Mg) MG tablet Take 500 mg by mouth daily. At bedtime   polyethylene glycol (MIRALAX / GLYCOLAX) 17 g packet Take 17 g by mouth daily. (Patient taking differently: Take 17 g by mouth daily as needed for mild constipation, moderate constipation or severe constipation.)   senna (SENOKOT) 8.6 MG TABS tablet Take 1 tablet by mouth. (Patient taking differently: Take 3 tablets by mouth daily.)     Allergies:   Shellfish-derived products, Betadine [povidone iodine], and Latex   Social History   Tobacco Use   Smoking status: Never   Smokeless tobacco: Never  Vaping Use   Vaping status: Never Used  Substance Use Topics   Alcohol use: Never   Drug use: Never     Family Hx: The patient's family history includes Cancer in her father; Hypertension in her mother; Thyroid disease in her maternal grandmother and mother.  ROS:   Please see the history of present illness.     All other systems reviewed and are negative.   Prior CV studies:   The following studies were reviewed today:  Cardiac  Studies & Procedures   ______________________________________________________________________________________________     ECHOCARDIOGRAM  ECHOCARDIOGRAM COMPLETE 06/24/2023  Narrative ECHOCARDIOGRAM REPORT    Patient Name:   [name] Date of Exam: 06/24/2023 Medical Rec #:  [id number]          Height:       63.0 in Accession #:    [id number]         Weight:       250.6 lb Date of Birth:  1985/07/03          BSA:          2.128 m Patient Age:    38 years           BP:           120/70 mmHg Patient Gender: F                  HR:           67 bpm. Exam Location:  Outpatient  Procedure: 2D Echo, 3D Echo, Cardiac Doppler, Color Doppler and Strain Analysis (Both Spectral and Color Flow Doppler were utilized during procedure).  Indications:    History of PE  History:        Patient has no prior history of Echocardiogram examinations. Arrythmias:Bradycardia; Risk Factors:Non-Smoker and Hypertension. Preeclampsia; Pregnant ; History of pulmonary embolism.  Sonographer:    Orvil Holmes RDCS Referring Phys: [id number] Trevious Rampey S Kaleo Condrey  IMPRESSIONS   1. Left ventricular ejection fraction, by estimation, is 55 to 60%. The left ventricle has normal function. The left ventricle has no regional wall motion abnormalities. Left ventricular diastolic parameters were normal. The average left ventricular global longitudinal strain is -17.9 %. 2. Right ventricular systolic function is normal. The right ventricular size is normal. There is normal pulmonary artery systolic pressure. 3. The mitral valve is grossly normal. Trivial mitral valve regurgitation. 4. The aortic valve is tricuspid. Aortic valve regurgitation is not visualized. 5. The inferior vena cava is normal in size with greater than 50% respiratory variability, suggesting right atrial pressure of 3 mmHg.  Comparison(s): No prior Echocardiogram.  FINDINGS Left Ventricle: Left ventricular ejection fraction, by estimation,  is 55 to 60%. The left ventricle has normal function. The left ventricle has no regional wall motion abnormalities. The average left ventricular global longitudinal strain is -17.9 %. 3D ejection fraction reviewed and evaluated as part of the interpretation. Alternate measurement of EF is felt to be most reflective of LV function. The left ventricular internal cavity size was normal in size. There is no left ventricular hypertrophy. Left ventricular diastolic parameters were normal.  Right Ventricle: The right ventricular size is normal. No increase in right ventricular wall thickness. Right ventricular systolic function is normal. There is normal pulmonary artery systolic pressure. The tricuspid regurgitant velocity is 1.41 m/s, and with an assumed right atrial pressure of 3 mmHg, the estimated right ventricular systolic pressure is 11.0 mmHg.  Left Atrium: Left atrial size was normal in size.  Right Atrium: Right atrial size was normal in size.  Pericardium: There is no evidence of pericardial effusion.  Mitral Valve: The mitral valve is grossly normal. Trivial mitral valve regurgitation.  Tricuspid Valve: The tricuspid valve is grossly normal. Tricuspid valve regurgitation is trivial.  Aortic Valve: The aortic valve is tricuspid. Aortic valve regurgitation is not visualized.  Pulmonic Valve: The pulmonic valve was normal in structure. Pulmonic valve regurgitation is not visualized.  Aorta: The aortic root and ascending aorta are structurally normal, with no evidence of dilitation.  Venous: The inferior vena cava is normal in size with greater than 50% respiratory variability, suggesting right atrial pressure of 3 mmHg.  IAS/Shunts: No atrial level shunt detected by color flow Doppler.  Additional Comments: 3D was performed not requiring image post processing on an independent workstation and was normal.   LEFT VENTRICLE PLAX 2D LVIDd:         4.30 cm   Diastology LVIDs:          3.03 cm   LV e' medial:    7.07 cm/s LV PW:         1.33 cm   LV E/e' medial:  10.8 LV IVS:        0.78 cm   LV e' lateral:   16.10 cm/s LVOT diam:     2.00 cm   LV E/e' lateral: 4.7 LV SV:         51 LV SV Index:   24  2D Longitudinal Strain LVOT Area:     3.14 cm  2D Strain GLS (A4C):   -18.3 % 2D Strain GLS (A3C):   -15.3 % 2D Strain GLS (A2C):   -20.2 % 2D Strain GLS Avg:     -17.9 %  3D Volume EF: 3D EF:        51 % LV EDV:       156 ml LV ESV:       76 ml LV SV:        80 ml  RIGHT VENTRICLE RV Basal diam:  4.18 cm RV Mid diam:    3.30 cm RV S prime:     14.00 cm/s TAPSE (M-mode): 2.8 cm  LEFT ATRIUM             Index        RIGHT ATRIUM           Index LA diam:        4.30 cm 2.02 cm/m   RA Area:     14.90 cm LA Vol (A2C):   29.5 ml 13.86 ml/m  RA Volume:   34.90 ml  16.40 ml/m LA Vol (A4C):   58.4 ml 27.44 ml/m LA Biplane Vol: 45.2 ml 21.24 ml/m AORTIC VALVE LVOT Vmax:   80.30 cm/s LVOT Vmean:  52.200 cm/s LVOT VTI:    0.163 m  AORTA Ao Root diam: 2.70 cm Ao Asc diam:  3.40 cm  MITRAL VALVE               TRICUSPID VALVE MV Area (PHT): 4.60 cm    TR Peak grad:   8.0 mmHg MV Decel Time: 165 msec    TR Vmax:        141.00 cm/s MV E velocity: 76.20 cm/s MV A velocity: 88.00 cm/s  SHUNTS MV E/A ratio:  0.87        Systemic VTI:  0.16 m Systemic Diam: 2.00 cm  Vinie Maxcy MD Electronically signed by Vinie Maxcy MD Signature Date/Time: 06/24/2023/3:17:23 PM    Final    MONITORS  LONG TERM MONITOR (3-14 DAYS) 12/08/2022  Narrative Patch Wear Time:  4 days and 3 hours (2024-10-22T10:35:55-0400 to 2024-10-26T14:31:11-398)  Patient had a min HR of 45 bpm, max HR of 160 bpm, and avg HR of 82 bpm. Predominant underlying rhythm was Sinus Rhythm. No VT, SVT, atrial fibrillation, high degree block, or pauses noted. Isolated atrial and ventricular ectopy was rare (<1%). There were 13 triggered events, which were sinus with or without a single PVC.  No high risk arrhythmias detected.       ______________________________________________________________________________________________       Labs/Other Tests and Data Reviewed:    EKG:  No ECG reviewed.  Recent Labs: 04/12/2023: ALT 78; BUN 8; Creatinine, Ser 0.62; Hemoglobin 11.7; Platelets 376; Potassium 4.0; Sodium 136   Recent Lipid Panel No results found for: CHOL, TRIG, HDL, CHOLHDL, LDLCALC, LDLDIRECT  Wt Readings from Last 3 Encounters:  08/13/23 258 lb (117 kg)  07/15/23 162 lb 4.8 oz (73.6 kg)  06/03/23 250 lb 9.6 oz (113.7 kg)     Risk Assessment/Calculations:          Objective:    Vital Signs:  BP 120/81   Pulse 79   Ht 5' 3 (1.6 m)   Wt 258 lb (117 kg)   SpO2 98%   BMI 45.70 kg/m    VITAL SIGNS:  reviewed GEN:  no acute distress RESPIRATORY:  normal respiratory effort,  symmetric expansion CARDIOVASCULAR:  no peripheral edema  ASSESSMENT & PLAN:    Pregnancy with history of preeclampsia / HTN  [redacted] weeks pregnant with history of preeclampsia. Under care of maternal fetal medicine and OB. - Continue regular follow-ups with maternal fetal medicine and OB. -Continue Labeilol 200mg  BID. BP at goal <130/80 -Echo 06/2023 normal LVEF, trivial MR, no significant findings.  - Contact healthcare provider if blood pressure consistently exceeds 130/80 mmHg.   History of pulmonary embolism/DVT/ Hypercoagulable state On Lovenox. Follows with hematology.    Asthma Childhood asthma has been well controlled recently. - Continue using inhaler as needed for asthma symptoms. -Continue to follow with PCP.             Time:   Today, I have spent 8 minutes with the patient with telehealth technology discussing the above problems.     Medication Adjustments/Labs and Tests Ordered: Current medicines are reviewed at length with the patient today.  Concerns regarding medicines are outlined above.   Tests Ordered: No orders of the defined types  were placed in this encounter.   Medication Changes: No orders of the defined types were placed in this encounter.   Follow Up:  as needed with cardiology   Signed, Reche GORMAN Finder, NP  08/13/2023 10:17 AM    [facility]

## 2023-09-26 ENCOUNTER — Inpatient Hospital Stay (HOSPITAL_COMMUNITY)
Admission: AD | Admit: 2023-09-26 | Discharge: 2023-09-26 | Disposition: A | Discharge status: Home / Self Care | Attending: Obstetrics and Gynecology | Admitting: Obstetrics and Gynecology

## 2023-09-26 ENCOUNTER — Inpatient Hospital Stay (HOSPITAL_COMMUNITY)

## 2023-09-26 ENCOUNTER — Other Ambulatory Visit: Payer: Self-pay

## 2023-09-26 ENCOUNTER — Encounter (HOSPITAL_COMMUNITY): Payer: Self-pay | Admitting: Obstetrics and Gynecology

## 2023-09-26 DIAGNOSIS — O26873 Cervical shortening, third trimester: Secondary | ICD-10-CM | POA: Diagnosis not present

## 2023-09-26 DIAGNOSIS — M7989 Other specified soft tissue disorders: Secondary | ICD-10-CM | POA: Diagnosis not present

## 2023-09-26 DIAGNOSIS — Z79899 Other long term (current) drug therapy: Secondary | ICD-10-CM | POA: Insufficient documentation

## 2023-09-26 DIAGNOSIS — O2230 Deep phlebothrombosis in pregnancy, unspecified trimester: Secondary | ICD-10-CM

## 2023-09-26 DIAGNOSIS — O2233 Deep phlebothrombosis in pregnancy, third trimester: Secondary | ICD-10-CM

## 2023-09-26 DIAGNOSIS — Z3A36 36 weeks gestation of pregnancy: Secondary | ICD-10-CM | POA: Diagnosis not present

## 2023-09-26 DIAGNOSIS — Z8759 Personal history of other complications of pregnancy, childbirth and the puerperium: Secondary | ICD-10-CM

## 2023-09-26 DIAGNOSIS — Z759 Unspecified problem related to medical facilities and other health care: Secondary | ICD-10-CM | POA: Insufficient documentation

## 2023-09-26 HISTORY — DX: Depression, unspecified: F32.A

## 2023-09-26 HISTORY — DX: Fatty (change of) liver, not elsewhere classified: K76.0

## 2023-09-26 HISTORY — DX: Anxiety disorder, unspecified: F41.9

## 2023-09-26 HISTORY — DX: Gestational (pregnancy-induced) hypertension without significant proteinuria, unspecified trimester: O13.9

## 2023-09-26 HISTORY — DX: Unspecified asthma, uncomplicated: J45.909

## 2023-09-26 MED ORDER — ENOXAPARIN SODIUM 100 MG/ML IJ SOSY
100.0000 mg | PREFILLED_SYRINGE | Freq: Two times a day (BID) | INTRAMUSCULAR | 0 refills | Status: AC
  Filled 2023-09-26: qty 12, 6d supply, fill #0

## 2023-09-26 MED ORDER — ENOXAPARIN SODIUM 40 MG/0.4ML IJ SOSY
20.0000 mg | PREFILLED_SYRINGE | Freq: Two times a day (BID) | INTRAMUSCULAR | 0 refills | Status: AC
  Filled 2023-09-26: qty 36, 90d supply, fill #0
  Filled 2023-09-28: qty 4, 5d supply, fill #0

## 2023-09-26 MED ORDER — ENOXAPARIN SODIUM 120 MG/0.8ML IJ SOSY
1.0000 mg/kg | PREFILLED_SYRINGE | Freq: Once | INTRAMUSCULAR | Status: AC
  Administered 2023-09-26: 120 mg via SUBCUTANEOUS
  Filled 2023-09-26: qty 0.8

## 2023-09-26 NOTE — MAU Provider Note (Signed)
 History     CSN: [id number]  Arrival date and time: 09/26/23 1403 First Provider Initiated Contact with Patient   Chief Complaint  Patient presents with   calf pain   Leg Swelling    HPI [name] is a 38 y.o. G2P0101 at [redacted]w[redacted]d, 10/23/2023, by Other Basis, who presents to the MAU with c/f DVT.  2 days ago, her Lovenox  was changed from 100 mg twice daily treatment dose to 40 mg daily prophylactic dose with consideration of her upcoming CS.  Since then, she has noticed bilateral calf pain, a new bruise on the left medial calf that appeared yesterday.  She reports this pain is similar to her prior DVT in October.  At that time, she had DVT and PE that were treated with Eliquis.  When she became pregnant in January, she was started on Lovenox  BID. This was changed to ppx dosing 2d ago.  She also reports having had COVID 1.5 weeks ago and spent about 1.5 days in the bed.  ROS Contractions: No  Fetal Movement: Yes  Vaginal bleeding: No  LOF: No   (+) b/l calf pain, LLE bruising (-) CP, SOB  Medications Prior to Admission  Medication Sig Dispense Refill Last Dose/Taking   acetaminophen  (TYLENOL ) 325 MG tablet Take 2 tablets (650 mg total) by mouth every 6 (six) hours as needed. 30 tablet 0 Past Week   aspirin EC 81 MG tablet Take 81 mg by mouth daily. Swallow whole.   09/26/2023   cyclobenzaprine  (FLEXERIL ) 10 MG tablet Take 1 tablet by mouth 3 times a day as needed 30 tablet 3 09/25/2023   ferrous sulfate 325 (65 FE) MG EC tablet Take 1 tablet by mouth daily with breakfast.   09/26/2023   glyBURIDE (DIABETA) 2.5 MG tablet Take 2.5 mg by mouth daily.   09/25/2023   labetalol  (NORMODYNE ) 200 MG tablet Take 200 mg by mouth 3 (three) times daily.   09/26/2023   levocetirizine (XYZAL) 5 MG tablet Take 5 mg by mouth every evening.   09/25/2023   senna (SENOKOT) 8.6 MG TABS tablet Take 1 tablet by mouth. (Patient taking differently: Take 3 tablets by mouth daily.)   09/26/2023   [DISCONTINUED]  enoxaparin  (LOVENOX ) 100 MG/ML injection Inject 100 mg into the skin every 12 (twelve) hours.   09/26/2023 at  8:30 AM   EPINEPHrine  0.3 mg/0.3 mL IJ SOAJ injection Inject 1 pen into the thigh as needed for anaphylaxis. (Patient taking differently: Inject 0.3 mg into the muscle as needed for anaphylaxis.) 2 each 3    famotidine  (PEPCID ) 20 MG tablet daily. (Patient taking differently: Take 20 mg by mouth as needed for heartburn or indigestion.)      ipratropium-albuterol  (DUONEB) 0.5-2.5 (3) MG/3ML SOLN Take 3 mLs by nebulization every 6 (six) hours as needed. 360 mL 0 Unknown   magnesium  oxide (MAG-OX) 400 (240 Mg) MG tablet Take 500 mg by mouth daily. At bedtime   Unknown   polyethylene glycol (MIRALAX / GLYCOLAX) 17 g packet Take 17 g by mouth daily. (Patient taking differently: Take 17 g by mouth daily as needed for mild constipation, moderate constipation or severe constipation.)       Past Medical History:  Diagnosis Date   Anxiety    Asthma    Degenerative disc disease, lumbar    Depression    Fatty liver    Gestational diabetes    Gestational hypertension    Mitral valve prolapse    Pregnancy induced hypertension  Preterm labor     Past Surgical History:  Procedure Laterality Date   CESAREAN SECTION N/A 12/02/2020   Procedure: CESAREAN SECTION;  Surgeon: Curlene Agent, MD;  Location: [facility] LD ORS;  Service: Obstetrics;  Laterality: N/A;   WISDOM TOOTH EXTRACTION       Allergies:  Allergies  Allergen Reactions   Shellfish-Derived Products Anaphylaxis   Betadine [Povidone Iodine] Itching and Rash   Latex Rash    ROS reviewed and pertinent positives and negatives as documented in HPI.    Physical Exam  BP 117/78   Pulse 84   Temp 98.2 F (36.8 C) (Oral)   Resp 18   Ht 5' 3 (1.6 m)   Wt 121.1 kg   SpO2 96%   BMI 47.30 kg/m   Gen: alert, no acute distress CV: regular rate and rhythm Resp: nonlabored, CTA b/l bases in the midaxillary line Abd:  gravid Extremities: R calf 44cm, L calf 43cm. No pedal edema, erythema, or u/l LE swelling.  FHT Baseline: 120 bpm Variability: Good {> 6 bpm) Accelerations: Reactive Decelerations: Absent Uterine activity: None  Labs    No results found for this or any previous visit (from the past 24 hours).  Imaging PRELIM REPORT RIGHT: There is no evidence of deep vein thrombosis in the lower extremity. No cystic structure found in the popliteal fossa.  LEFT: Findings consistent with acute deep vein thrombosis involving one of the left posterior tibial veins in the proximal to mid portion..   Procedures  Assessment and Plan  MDM [name] Groome is a 38 y.o. G2P0101 at [redacted]w[redacted]d, 10/23/2023, by Other Basis, who presents to the MAU for b/l calf pain and c/f DVT. 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 She has been on therapeutic dose AC until 2d ago. Ddx: MSK pain, DVT, usual 3rd trimester swelling, peripheral edema c/f preE.   Prelim read of Doppler showing LLE partially occluded posterior tibial DVT. Plan to resume therapeutic dose Lovenox , 120mg  BID based on today's weight. Discussed holding AC for 24h prior to scheduled CS. If last dose <24h, will require general anesthesia for CS. Patient expressed understanding and agreeable to this plan. Will administer first dose before DC. Dr. Mat (OBGYN, [facility]) notified by phone, will route chart to Dr. Vaughn (Hematology, [facility])  1. [redacted] weeks gestation of pregnancy (Primary)  2. DVT (deep vein thrombosis) in pregnancy  3. Short cervix during pregnancy in third trimester  4. History of preterm premature rupture of membranes (PPROM)  Orders Placed This Encounter  Procedures   Discharge patient Discharge disposition: 01-Home or Self Care; Discharge patient date: 09/26/2023   VAS US  LOWER EXTREMITY VENOUS (DVT) (ONLY [facility] & [facility])    Dispo: DC home in stable condition with return precautions discussed and included in AVS.   Barabara Maier, DO FMOB Fellow, Faculty  practice [facility], [facility] [employer]

## 2023-09-26 NOTE — MAU Note (Signed)
 [name] is a 38 y.o. at [redacted]w[redacted]d here in MAU reporting: bilateral calf pain and swelling. Pt reports right is more painful than the left and there is a new bruise present on left calf. No redness or warmth noted. Reports not having typical DVT symptoms with her last clot. Pt on lovenox  BID had am dose at 0830. Ongoing cramping/sharp pain in abdomen on the way to the hospital. Denies any VB or LOF. Baby has been very sleepy today but has moved normally the last 2 hours.  Headache today and vision changes (depth perception) that started yesterday. GDM- started glyburide 2.5mg  Friday at bedtime   Onset of complaint: Friday  Pain score: 4 right 3 left  Vitals:   09/26/23 1430  BP: 138/84  Pulse: 77  Resp: 18  Temp: 98.2 F (36.8 C)     FHT:120 Lab orders placed from triage:  DVT US 

## 2023-09-26 NOTE — Progress Notes (Signed)
 VASCULAR LAB    Bilateral lower extremity venous duplex has been performed.  See CV proc for preliminary results.  Gave verbal report to Dr. Danny LIS, [facility], RVT 09/26/2023, 5:52 PM

## 2023-09-26 NOTE — Discharge Instructions (Addendum)
-   CHANGE Lovenox  dose to 120mg  twice per day until 24 hours before your C-Section. With the supplies you have, this means 100mg  (1 syringe) + 20mg  (1/2 of 40mg  syringe). You will need to do both syringes, one immediately after the other, twice per day. You should not inject both doses into the same site, continue to rotate injection sites.  - Stop Lovenox  24hr before your C-Section - If your CS needs to occur less than 24h after your last dose of Lovenox , it will need to be done under general anesthesia because it would not be safe to give you an epidural or spinal anesthesia. - You will need to continue this dose after delivery, discuss specific duration, dose adjustments, and further testing with your hematologist.

## 2023-09-27 ENCOUNTER — Other Ambulatory Visit (HOSPITAL_COMMUNITY): Payer: Self-pay

## 2023-09-28 ENCOUNTER — Other Ambulatory Visit (HOSPITAL_COMMUNITY): Payer: Self-pay

## 2023-09-30 ENCOUNTER — Encounter (HOSPITAL_COMMUNITY)
Admission: RE | Admit: 2023-09-30 | Source: Ambulatory Visit | Attending: Obstetrics and Gynecology | Admitting: Obstetrics and Gynecology

## 2023-10-02 ENCOUNTER — Inpatient Hospital Stay (HOSPITAL_COMMUNITY): Admission: RE | Admit: 2023-10-02 | Source: Home / Self Care | Admitting: Obstetrics & Gynecology

## 2023-10-02 DIAGNOSIS — O34219 Maternal care for unspecified type scar from previous cesarean delivery: Secondary | ICD-10-CM

## 2023-10-02 SURGERY — Surgical Case
Anesthesia: Regional

## 2023-10-06 ENCOUNTER — Encounter (HOSPITAL_COMMUNITY): Admission: RE | Admit: 2023-10-06 | Source: Ambulatory Visit

## 2023-10-08 ENCOUNTER — Encounter (HOSPITAL_COMMUNITY): Admission: RE | Payer: Self-pay | Source: Home / Self Care
